# Patient Record
Sex: Female | Born: 1995
Health system: Southern US, Community
[De-identification: ages and names within clinical notes are randomized; demographics above are authoritative.]

## PROBLEM LIST (undated history)

## (undated) DIAGNOSIS — R630 Anorexia: Secondary | ICD-10-CM

## (undated) DIAGNOSIS — F603 Borderline personality disorder: Secondary | ICD-10-CM

## (undated) DIAGNOSIS — T7840XA Allergy, unspecified, initial encounter: Secondary | ICD-10-CM

## (undated) DIAGNOSIS — F419 Anxiety disorder, unspecified: Secondary | ICD-10-CM

## (undated) DIAGNOSIS — F32A Depression, unspecified: Secondary | ICD-10-CM

## (undated) DIAGNOSIS — F319 Bipolar disorder, unspecified: Secondary | ICD-10-CM

## (undated) DIAGNOSIS — D649 Anemia, unspecified: Secondary | ICD-10-CM

## (undated) HISTORY — DX: Allergy, unspecified, initial encounter: T78.40XA

---
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
## 2015-01-20 ENCOUNTER — Ambulatory Visit (INDEPENDENT_AMBULATORY_CARE_PROVIDER_SITE_OTHER): Payer: BLUE CROSS/BLUE SHIELD | Admitting: Licensed Clinical Social Worker

## 2015-01-20 DIAGNOSIS — F322 Major depressive disorder, single episode, severe without psychotic features: Secondary | ICD-10-CM | POA: Diagnosis not present

## 2015-01-25 ENCOUNTER — Ambulatory Visit: Payer: BLUE CROSS/BLUE SHIELD | Admitting: Internal Medicine

## 2016-07-08 ENCOUNTER — Emergency Department (HOSPITAL_BASED_OUTPATIENT_CLINIC_OR_DEPARTMENT_OTHER)
Admission: EM | Admit: 2016-07-08 | Discharge: 2016-07-08 | Disposition: A | Discharge status: Home / Self Care | Payer: Managed Care, Other (non HMO) | Attending: Emergency Medicine | Admitting: Emergency Medicine

## 2016-07-08 ENCOUNTER — Encounter (HOSPITAL_BASED_OUTPATIENT_CLINIC_OR_DEPARTMENT_OTHER): Payer: Self-pay

## 2016-07-08 DIAGNOSIS — R1013 Epigastric pain: Secondary | ICD-10-CM

## 2016-07-08 DIAGNOSIS — K2900 Acute gastritis without bleeding: Secondary | ICD-10-CM | POA: Insufficient documentation

## 2016-07-08 DIAGNOSIS — F17210 Nicotine dependence, cigarettes, uncomplicated: Secondary | ICD-10-CM | POA: Diagnosis not present

## 2016-07-08 DIAGNOSIS — J029 Acute pharyngitis, unspecified: Secondary | ICD-10-CM | POA: Insufficient documentation

## 2016-07-08 DIAGNOSIS — Z79899 Other long term (current) drug therapy: Secondary | ICD-10-CM | POA: Diagnosis not present

## 2016-07-08 DIAGNOSIS — R0981 Nasal congestion: Secondary | ICD-10-CM | POA: Diagnosis present

## 2016-07-08 HISTORY — DX: Bipolar disorder, unspecified: F31.9

## 2016-07-08 LAB — CBC WITH DIFFERENTIAL/PLATELET
Basophils Absolute: 0 10*3/uL (ref 0.0–0.1)
Basophils Relative: 0 %
EOS ABS: 0.2 10*3/uL (ref 0.0–0.7)
EOS PCT: 2 %
HCT: 38 % (ref 36.0–46.0)
HEMOGLOBIN: 12.8 g/dL (ref 12.0–15.0)
LYMPHS ABS: 3.4 10*3/uL (ref 0.7–4.0)
Lymphocytes Relative: 25 %
MCH: 31.4 pg (ref 26.0–34.0)
MCHC: 33.7 g/dL (ref 30.0–36.0)
MCV: 93.1 fL (ref 78.0–100.0)
MONO ABS: 0.9 10*3/uL (ref 0.1–1.0)
MONOS PCT: 6 %
Neutro Abs: 8.8 10*3/uL — ABNORMAL HIGH (ref 1.7–7.7)
Neutrophils Relative %: 67 %
PLATELETS: 296 10*3/uL (ref 150–400)
RBC: 4.08 MIL/uL (ref 3.87–5.11)
RDW: 13.2 % (ref 11.5–15.5)
WBC: 13.4 10*3/uL — ABNORMAL HIGH (ref 4.0–10.5)

## 2016-07-08 LAB — COMPREHENSIVE METABOLIC PANEL
ALK PHOS: 48 U/L (ref 38–126)
ALT: 10 U/L — ABNORMAL LOW (ref 14–54)
ANION GAP: 7 (ref 5–15)
AST: 18 U/L (ref 15–41)
Albumin: 4.6 g/dL (ref 3.5–5.0)
BUN: 9 mg/dL (ref 6–20)
CALCIUM: 9.6 mg/dL (ref 8.9–10.3)
CO2: 26 mmol/L (ref 22–32)
Chloride: 104 mmol/L (ref 101–111)
Creatinine, Ser: 0.62 mg/dL (ref 0.44–1.00)
GFR calc non Af Amer: >60 mL/min (ref >60)
Glucose, Bld: 92 mg/dL (ref 65–99)
POTASSIUM: 3.3 mmol/L — AB (ref 3.5–5.1)
SODIUM: 137 mmol/L (ref 135–145)
TOTAL PROTEIN: 7.7 g/dL (ref 6.5–8.1)
Total Bilirubin: 0.2 mg/dL — ABNORMAL LOW (ref 0.3–1.2)

## 2016-07-08 LAB — LIPASE, BLOOD: Lipase: 23 U/L (ref 11–51)

## 2016-07-08 MED ORDER — FAMOTIDINE 20 MG PO TABS: 20.0000 mg | ORAL_TABLET | Freq: Two times a day (BID) | ORAL | 0 refills | Status: DC

## 2016-07-08 MED ORDER — GI COCKTAIL ~~LOC~~
30.0000 mL | Freq: Once | ORAL | Status: AC
  Administered 2016-07-08: 30 mL via ORAL
  Filled 2016-07-08: qty 30

## 2016-07-08 NOTE — ED Triage Notes (Addendum)
C/o CP, worse with deep breath, "tender rib cage" x 4 days-states today having chills and sore throat-NAD-steady gait-pt seen at [facility]-states she had EKG, CXR and UA-results with pt

## 2016-07-08 NOTE — ED Notes (Signed)
C/o cp in sternum area x 4 days,  Denies inj pain increased w deep breath and movement,  Denies n/v/sob,

## 2016-07-08 NOTE — ED Provider Notes (Signed)
[facility] Provider Note   CSN: [id number] Arrival date & time: 07/08/16  1918 By signing my name below, I, Bridgette HabermannMaria Tan, attest that this documentation has been prepared under the direction and in the presence of Gwyneth SproutWhitney Glenwood Revoir, MD. Electronically Signed: Bridgette HabermannMaria Tan, ED Scribe. 07/08/16. 7:55 PM.  History   Chief Complaint Chief Complaint  Patient presents with  . Chest Pain   HPI Comments: Loretta Baker is a 20 y.o. female with no pertinent PMHx, who presents to the Emergency Department complaining of aching, throbbing, 6/10 chest pain onset 4 days ago. Pt also has associated chills, congestion, and sore throat. Pain is exacerbated with deep breathing and eating. Pt was seen at [facility] and notes she had an EKG, CXR and UA done and then sent here. Pt denies h/o GERD, DVT, PE, or asthma. No recent injury or long travel. She denies h/o similar symptoms. Pt is a smoker. She further denies fever, dysuria, nausea, vomiting, diarrhea, cough, or any other associated symptoms.   The history is provided by the patient. No language interpreter was used.    Past Medical History:  Diagnosis Date  . Allergy   . Bipolar disorder [facility](HCC)     Patient Active Problem List   Diagnosis Date Noted  . Tinnitus 12/14/2014  . GAD (generalized anxiety disorder) 12/14/2014    History reviewed. No pertinent surgical history.  OB History    No data available       Home Medications    Prior to Admission medications   Medication Sig Start Date End Date Taking? Authorizing Provider  ALPRAZolam (XANAX PO) Take by mouth.   Yes Historical Provider, MD  BuPROPion HCl (WELLBUTRIN PO) Take by mouth.   Yes Historical Provider, MD  FLUoxetine HCl (PROZAC PO) Take by mouth.   Yes Historical Provider, MD  LamoTRIgine (LAMICTAL PO) Take by mouth.   Yes Historical Provider, MD    Family History Family History  Problem Relation Age of Onset  . Arthritis Other   . Alcohol abuse Neg Hx   .  Cancer Neg Hx   . Depression Neg Hx   . Diabetes Neg Hx   . Early death Neg Hx   . Heart disease Neg Hx   . Hyperlipidemia Neg Hx   . Hypertension Neg Hx   . Stroke Neg Hx     Social History Social History  Substance Use Topics  . Smoking status: Current Every Day Smoker    Types: Cigarettes  . Smokeless tobacco: Never Used  . Alcohol use No     Allergies   Review of patient's allergies indicates no known allergies.   Review of Systems Review of Systems  Constitutional: Positive for chills. Negative for fever.  HENT: Positive for congestion and sore throat.   Respiratory: Negative for cough.   Cardiovascular: Positive for chest pain.  Gastrointestinal: Negative for diarrhea, nausea and vomiting.  Genitourinary: Negative for dysuria.  All other systems reviewed and are negative.    Physical Exam Updated Vital Signs BP 124/78 (BP Location: Left Arm)   Pulse 74   Temp 98.1 F (36.7 C) (Oral)   Resp 16   Ht 5\' 1"  (1.549 m)   Wt 105 lb (47.6 kg)   LMP 07/03/2016   SpO2 100%   BMI 19.84 kg/m   Physical Exam  Constitutional: She is oriented to person, place, and time. She appears well-developed and well-nourished.  HENT:  Head: Normocephalic and atraumatic.  Nose: Nose normal.  Eyes: EOM are normal. Pupils are equal, round, and reactive to light.  Neck: Normal range of motion. Neck supple. No JVD present.  Cardiovascular: Normal rate, regular rhythm and normal heart sounds.   No murmur heard. Pulmonary/Chest: Effort normal and breath sounds normal. She has no wheezes. She has no rales. She exhibits no tenderness.  Abdominal: Soft. Bowel sounds are normal. She exhibits no distension and no mass. There is tenderness. There is guarding. There is negative Murphy's sign.  Moderate epigastric tenderness with mild guarding.   Musculoskeletal: Normal range of motion. She exhibits no edema.  Lymphadenopathy:    She has no cervical adenopathy.  Neurological: She is  alert and oriented to person, place, and time. No cranial nerve deficit. She exhibits normal muscle tone. Coordination normal.  Skin: Skin is warm and dry. No rash noted.  Psychiatric: She has a normal mood and affect. Her behavior is normal. Judgment and thought content normal.  Nursing note and vitals reviewed.    ED Treatments / Results  DIAGNOSTIC STUDIES: Oxygen Saturation is 100% on RA, normal by my interpretation.    COORDINATION OF CARE: 7:55 PM Discussed treatment plan with pt at bedside which includes blood work and pt agreed to plan.  Labs (all labs ordered are listed, but only abnormal results are displayed) Labs Reviewed  CBC WITH DIFFERENTIAL/PLATELET - Abnormal; Notable for the following:       Result Value   WBC 13.4 (*)    Neutro Abs 8.8 (*)    All other components within normal limits  COMPREHENSIVE METABOLIC PANEL - Abnormal; Notable for the following:    Potassium 3.3 (*)    ALT 10 (*)    Total Bilirubin 0.2 (*)    All other components within normal limits  LIPASE, BLOOD    EKG  EKG Interpretation  Date/Time:  Monday July 08 2016 19:38:25 EDT Ventricular Rate:  76 PR Interval:    QRS Duration: 90 QT Interval:  354 QTC Calculation: 398 R Axis:   84 Text Interpretation:  Sinus arrhythmia No previous tracing Confirmed by Anitra LauthPLUNKETT  MD, Alphonzo LemmingsWHITNEY ([id number]) on 07/08/2016 7:47:56 PM       Radiology No results found.  Procedures Procedures (including critical care time)  Medications Ordered in ED Medications - No data to display   Initial Impression / Assessment and Plan / ED Course  I have reviewed the triage vital signs and the nursing notes.  Pertinent labs & imaging results that were available during my care of the patient were reviewed by me and considered in my medical decision making (see chart for details).  Clinical Course   Patient is a healthy 20 year old female with a history of bipolar disease presenting today with epigastric and  chest pain starting 4 days ago. Pain is worse with eating and she did notice sore throat when she woke up this morning. She denies any URI symptoms such as cough, rhinorrhea, fever. She denies any diarrhea but has had some stomach cramping this week but attributed it to her menses. Patient was seen in urgent care today and had an EKG which was within normal limits, normal chest x-ray and a negative urine. Patient denies any OCPs, unilateral leg pain or swelling, travel or prior history of PEs in herself or her family. PERC neg.  low suspicion for dissection at this time and no right upper quadrant tenderness consistent with cholecystitis. Patient denies excessive alcohol use or NSAID use. She also denies recent travel, bad food exposure or  antibiotics. Feel most likely patient's symptoms could be viral related or GERD. She was given GI cocktail. CBC, CMP, lipase pending  9:57 PM Labs with mild cytosis of 13,000 but otherwise normal CMP and lipase. Patient improved after GI cocktail. Feel that may be viral in origin. Patient placed on Pepcid and instructed to follow-up with PCP in 2 weeks if symptoms not improving. Final Clinical Impressions(s) / ED Diagnoses   Final diagnoses:  Acute epigastric pain  Acute gastritis without hemorrhage, unspecified gastritis type    New Prescriptions New Prescriptions   FAMOTIDINE (PEPCID) 20 MG TABLET    Take 1 tablet (20 mg total) by mouth 2 (two) times daily.   I personally performed the services described in this documentation, which was scribed in my presence.  The recorded information has been reviewed and considered.     Gwyneth Sprout, MD 07/08/16 2158

## 2018-07-21 ENCOUNTER — Ambulatory Visit: Payer: Self-pay

## 2018-07-21 NOTE — Telephone Encounter (Signed)
Patient called in with c/o "numbness, tingling to legs and arms." She says "this has been going on for about 1 1/2 years. I have numbness and tingling to my legs and feet, pin and needles pain. This goes on for about a month, so bad I can barely walk from the pain to my feet and it hurts to even put on pants. My skin feels like it's burning. After a month, it goes away and I'm fine. Right now, I don't have any symptoms. I just wanted to call since I have insurance now, to be seen about it." I asked about other symptoms, she denies. According to protocol, see PCP within 3 days. I advised that I will have to call to the office for the appointment due to the patient hasn't been seen by Dr. Yetta BarreJones since 2016, she verbalized understanding and agreed to hold the line. I called and spoke to [place], [facility] who received authorization to schedule the patient. She asked if the patient can come on Thursday, 07/23/18 at 1030, I asked the patient and she agreed to the appointment. [place] was made aware. I advised the patient to arrive at 1015, to bring her insurance card, photo ID, list of medications and money to cover a co-pay, if indicated, she verbalized understanding. Care advice given, patient verbalized understanding.   Reason for Disposition . [1] Numbness or tingling in one or both feet AND [2] is a chronic symptom (recurrent or ongoing AND present > 4 weeks)  Answer Assessment - Initial Assessment Questions 1. SYMPTOM: "What is the main symptom you are concerned about?" (e.g., weakness, numbness)     Numbness to arms and legs 2. ONSET: "When did this start?" (minutes, hours, days; while sleeping)     About 1 1/2 years, but becoming more frequent and more painful 3. LAST NORMAL: "When was the last time you were normal (no symptoms)?"     Right now I feel normal 4. PATTERN "Does this come and go, or has it been constant since it started?"  "Is it present now?"     Come and go; not present 5. CARDIAC  SYMPTOMS: "Have you had any of the following symptoms: chest pain, difficulty breathing, palpitations?"     No 6. NEUROLOGIC SYMPTOMS: "Have you had any of the following symptoms: headache, dizziness, vision loss, double vision, changes in speech, unsteady on your feet?"     No 7. OTHER SYMPTOMS: "Do you have any other symptoms?"     No 8. PREGNANCY: "Is there any chance you are pregnant?" "When was your last menstrual period?"     No; LMP October 28th  Protocols used: NEUROLOGIC DEFICIT-A-AH

## 2018-07-23 ENCOUNTER — Ambulatory Visit (INDEPENDENT_AMBULATORY_CARE_PROVIDER_SITE_OTHER): Payer: BLUE CROSS/BLUE SHIELD | Admitting: Internal Medicine

## 2018-07-23 ENCOUNTER — Encounter: Payer: Self-pay | Admitting: Internal Medicine

## 2018-07-23 ENCOUNTER — Other Ambulatory Visit (INDEPENDENT_AMBULATORY_CARE_PROVIDER_SITE_OTHER): Payer: BLUE CROSS/BLUE SHIELD

## 2018-07-23 ENCOUNTER — Encounter: Payer: Self-pay | Admitting: Neurology

## 2018-07-23 ENCOUNTER — Telehealth: Payer: Self-pay | Admitting: Internal Medicine

## 2018-07-23 VITALS — BP 106/70 | HR 98 | Temp 98.6°F | Resp 16 | Ht 61.0 in | Wt 116.8 lb

## 2018-07-23 DIAGNOSIS — R202 Paresthesia of skin: Secondary | ICD-10-CM

## 2018-07-23 DIAGNOSIS — F319 Bipolar disorder, unspecified: Secondary | ICD-10-CM

## 2018-07-23 DIAGNOSIS — Z720 Tobacco use: Secondary | ICD-10-CM

## 2018-07-23 DIAGNOSIS — F411 Generalized anxiety disorder: Secondary | ICD-10-CM

## 2018-07-23 DIAGNOSIS — F1210 Cannabis abuse, uncomplicated: Secondary | ICD-10-CM | POA: Insufficient documentation

## 2018-07-23 DIAGNOSIS — R200 Anesthesia of skin: Secondary | ICD-10-CM

## 2018-07-23 DIAGNOSIS — Z23 Encounter for immunization: Secondary | ICD-10-CM | POA: Diagnosis not present

## 2018-07-23 LAB — CBC WITH DIFFERENTIAL/PLATELET
BASOS PCT: 0.8 % (ref 0.0–3.0)
Basophils Absolute: 0.1 10*3/uL (ref 0.0–0.1)
EOS ABS: 0.2 10*3/uL (ref 0.0–0.7)
Eosinophils Relative: 2.1 % (ref 0.0–5.0)
HEMATOCRIT: 41.1 % (ref 36.0–46.0)
Hemoglobin: 13.6 g/dL (ref 12.0–15.0)
LYMPHS PCT: 32.6 % (ref 12.0–46.0)
Lymphs Abs: 2.7 10*3/uL (ref 0.7–4.0)
MCHC: 33.2 g/dL (ref 30.0–36.0)
MCV: 92.3 fl (ref 78.0–100.0)
MONOS PCT: 8.6 % (ref 3.0–12.0)
Monocytes Absolute: 0.7 10*3/uL (ref 0.1–1.0)
NEUTROS ABS: 4.6 10*3/uL (ref 1.4–7.7)
Neutrophils Relative %: 55.9 % (ref 43.0–77.0)
PLATELETS: 338 10*3/uL (ref 150.0–400.0)
RBC: 4.46 Mil/uL (ref 3.87–5.11)
RDW: 13.5 % (ref 11.5–15.5)
WBC: 8.2 10*3/uL (ref 4.0–10.5)

## 2018-07-23 LAB — URINALYSIS, ROUTINE W REFLEX MICROSCOPIC
Bilirubin Urine: NEGATIVE
Hgb urine dipstick: NEGATIVE
Ketones, ur: NEGATIVE
Nitrite: NEGATIVE
PH: 7 (ref 5.0–8.0)
RBC / HPF: NONE SEEN (ref 0–?)
SPECIFIC GRAVITY, URINE: 1.01 (ref 1.000–1.030)
Total Protein, Urine: NEGATIVE
URINE GLUCOSE: NEGATIVE
UROBILINOGEN UA: 0.2 (ref 0.0–1.0)

## 2018-07-23 LAB — COMPREHENSIVE METABOLIC PANEL
ALK PHOS: 47 U/L (ref 39–117)
ALT: 7 U/L (ref 0–35)
AST: 13 U/L (ref 0–37)
Albumin: 5.2 g/dL (ref 3.5–5.2)
BUN: 11 mg/dL (ref 6–23)
CO2: 27 mEq/L (ref 19–32)
Calcium: 10.3 mg/dL (ref 8.4–10.5)
Chloride: 105 mEq/L (ref 96–112)
Creatinine, Ser: 0.69 mg/dL (ref 0.40–1.20)
GFR: 112.48 mL/min (ref >60.00)
GLUCOSE: 94 mg/dL (ref 70–99)
POTASSIUM: 4.8 meq/L (ref 3.5–5.1)
SODIUM: 140 meq/L (ref 135–145)
TOTAL PROTEIN: 8.1 g/dL (ref 6.0–8.3)
Total Bilirubin: 0.5 mg/dL (ref 0.2–1.2)

## 2018-07-23 LAB — HCG, QUANTITATIVE, PREGNANCY: Quantitative HCG: 0.28 m[IU]/mL

## 2018-07-23 LAB — VITAMIN B12: VITAMIN B 12: 338 pg/mL (ref 211–911)

## 2018-07-23 LAB — FOLATE: FOLATE: 8.1 ng/mL (ref >5.9)

## 2018-07-23 LAB — SEDIMENTATION RATE: Sed Rate: 8 mm/hr (ref 0–20)

## 2018-07-23 LAB — TSH: TSH: 1.18 u[IU]/mL (ref 0.35–4.50)

## 2018-07-23 LAB — CK: Total CK: 49 U/L (ref 7–177)

## 2018-07-23 MED ORDER — LAMOTRIGINE 25 MG PO TABS: 25.0000 mg | ORAL_TABLET | Freq: Two times a day (BID) | ORAL | 1 refills | Status: AC

## 2018-07-23 NOTE — Telephone Encounter (Signed)
Patient calling and states that her alprazolam prescription was never sent. Please advise. Patient states that she needs this prescription. Would like a phone call tomorrow if this can not be done.

## 2018-07-23 NOTE — Telephone Encounter (Signed)
Copied from CRM [phone]#[id number]. Topic: General - Other >> Jul 23, 2018 11:57 AM Leafy Roobinson, Norma J wrote: Reason for CRM: pt saw dr Yetta Barrejones today and would like new rx  [facility]

## 2018-07-23 NOTE — Progress Notes (Signed)
Subjective:  Patient ID: Loretta Baker Schermerhorn, female    DOB: 1996/03/10  Age: 22 y.o. MRN: [id number]  CC: Anxiety   HPI Loretta Baker Hunkins presents for multiple concerns.  1.  She tells me that she has a history of bipolar disorder, obsessive-compulsive disorder, and eating disorder and over the last few months she complains of paralyzing anxiety and panic to the point that she is afraid to leave her house.  She tells me she was previously treated successfully with Xanax.  She also wants to start lamotrigine for bipolar disorder and to be referred to a psychiatrist.  She denies SI or HI.  2.  She complains of a nearly 2-year history of throbbing, burning pain throughout her lower extremities as well as numbness and tingling in both upper extremities.  She describes the discomfort as feeling like her skin is extremely sensitive.  She denies headache, neck pain, or low back pain.  She denies visual disturbance or ataxia.  Outpatient Medications Prior to Visit  Medication Sig Dispense Refill  . ALPRAZolam (XANAX PO) Take by mouth.    . BuPROPion HCl (WELLBUTRIN PO) Take by mouth.    . famotidine (PEPCID) 20 MG tablet Take 1 tablet (20 mg total) by mouth 2 (two) times daily. 28 tablet 0  . FLUoxetine HCl (PROZAC PO) Take by mouth.    . LamoTRIgine (LAMICTAL PO) Take by mouth.     No facility-administered medications prior to visit.     ROS Review of Systems  Constitutional: Positive for fatigue. Negative for appetite change, diaphoresis and unexpected weight change.  HENT: Negative.   Eyes: Negative for visual disturbance.  Respiratory: Negative for cough, chest tightness, shortness of breath and wheezing.   Cardiovascular: Negative for chest pain, palpitations and leg swelling.  Gastrointestinal: Negative.  Negative for abdominal pain, constipation, diarrhea, nausea and vomiting.  Endocrine: Negative.   Genitourinary: Negative.  Negative for decreased urine volume, difficulty  urinating, vaginal bleeding and vaginal discharge.  Musculoskeletal: Positive for myalgias. Negative for arthralgias, back pain, joint swelling, neck pain and neck stiffness.  Skin: Negative for color change and rash.  Neurological: Positive for numbness. Negative for dizziness, tremors, seizures, speech difficulty, weakness, light-headedness and headaches.  Hematological: Negative for adenopathy. Does not bruise/bleed easily.  Psychiatric/Behavioral: Positive for dysphoric mood and sleep disturbance. Negative for agitation, behavioral problems, confusion, decreased concentration, hallucinations, self-injury and suicidal ideas. The patient is nervous/anxious. The patient is not hyperactive.     Objective:  BP 106/70 (BP Location: Left Arm, Patient Position: Sitting, Cuff Size: Normal)   Pulse 98   Temp 98.6 F (37 C) (Oral)   Resp 16   Ht 5\' 1"  (1.549 m)   Wt 116 lb 12 oz (53 kg)   LMP 07/06/2018   SpO2 99%   BMI 22.06 kg/m   BP Readings from Last 3 Encounters:  07/23/18 106/70  07/08/16 111/66  12/14/14 102/60    Wt Readings from Last 3 Encounters:  07/23/18 116 lb 12 oz (53 kg)  07/08/16 105 lb (47.6 kg)  12/14/14 112 lb (50.8 kg) (21 %, Z= -0.82)*   * Growth percentiles are based on CDC (Girls, 2-20 Years) data.    Physical Exam  Constitutional: She is oriented to person, place, and time. No distress.  HENT:  Mouth/Throat: Oropharynx is clear and moist. No oropharyngeal exudate.  Eyes: Conjunctivae are normal. No scleral icterus.  Neck: Normal range of motion. Neck supple. No JVD present. No thyromegaly present.  Cardiovascular:  Normal rate, regular rhythm and normal heart sounds. Exam reveals no gallop.  No murmur heard. Pulmonary/Chest: Effort normal and breath sounds normal. No respiratory distress. She has no wheezes. She has no rales.  Abdominal: Soft. Bowel sounds are normal. She exhibits no mass. There is no hepatosplenomegaly. There is no tenderness.    Musculoskeletal: Normal range of motion. She exhibits no edema, tenderness or deformity.  Lymphadenopathy:    She has no cervical adenopathy.  Neurological: She is alert and oriented to person, place, and time. She has normal strength. She displays abnormal reflex. She displays no atrophy and no tremor. No cranial nerve deficit or sensory deficit. She exhibits normal muscle tone. She displays a negative Romberg sign. She displays no seizure activity. Coordination and gait normal. She displays no Babinski's sign on the right side. She displays no Babinski's sign on the left side.  Reflex Scores:      Tricep reflexes are 1+ on the right side and 0 on the left side.      Bicep reflexes are 1+ on the right side and 0 on the left side.      Brachioradialis reflexes are 1+ on the right side.      Patellar reflexes are 1+ on the right side and 2+ on the left side.      Achilles reflexes are 1+ on the right side and 0 on the left side. Skin: Skin is warm and dry. She is not diaphoretic. No pallor.  Vitals reviewed.   Lab Results  Component Value Date   WBC 8.2 07/23/2018   HGB 13.6 07/23/2018   HCT 41.1 07/23/2018   PLT 338.0 07/23/2018   GLUCOSE 94 07/23/2018   ALT 7 07/23/2018   AST 13 07/23/2018   NA 140 07/23/2018   K 4.8 07/23/2018   CL 105 07/23/2018   CREATININE 0.69 07/23/2018   BUN 11 07/23/2018   CO2 27 07/23/2018   TSH 1.18 07/23/2018    No results found.  Assessment & Plan:   Saraiyah was seen today for anxiety.  Diagnoses and all orders for this visit:  Need for Tdap vaccination -     Tdap vaccine greater than or equal to 7yo IM  Need for influenza vaccination -     Flu Vaccine QUAD 36+ mos IM  GAD (generalized anxiety disorder) -     ALPRAZolam (XANAX) 0.25 MG tablet; Take 1 tablet (0.25 mg total) by mouth 3 (three) times daily as needed for anxiety.  Marijuana abuse  Tobacco abuse  Bipolar 1 disorder (HCC)- I will screen her urine for substance abuse.  Her  beta-hCG is negative.  Will restart low-dose lamotrigine.  I also referred her to Forde Radon (a psychiatrist at [facility].)  I gave her his name, phone number, and address and asked her to schedule an appt with him as soon as possible. -     hCG, quantitative, pregnancy; Future -     Urine drugs of abuse scrn w alc, routine (Ref Lab); Future -     lamoTRIgine (LAMICTAL) 25 MG tablet; Take 1 tablet (25 mg total) by mouth 2 (two) times daily.  Numbness and tingling of upper and lower extremities of both sides- Her labs are negative for any metabolic or organic condition that would cause her neurological symptoms.  Her exam is only remarkable for being hyperreflexic in the left patella.  I have asked her to undergo an NCS/EMG to see if there is some element of myopathy or  neuropathy to explain her symptoms. -     CBC with Differential/Platelet; Future -     TSH; Future -     hCG, quantitative, pregnancy; Future -     Comprehensive metabolic panel; Future -     Vitamin B12; Future -     Sedimentation rate; Future -     CK; Future -     Folate; Future -     Urine drugs of abuse scrn w alc, routine (Ref Lab); Future -     Ambulatory referral to Neurology -     Urinalysis, Routine w reflex microscopic; Future   I have discontinued Aariona Baker. Stegeman's LamoTRIgine (LAMICTAL PO), FLUoxetine HCl (PROZAC PO), ALPRAZolam (XANAX PO), BuPROPion HCl (WELLBUTRIN PO), and famotidine. I am also having her start on lamoTRIgine and ALPRAZolam.  Meds ordered this encounter  Medications  . lamoTRIgine (LAMICTAL) 25 MG tablet    Sig: Take 1 tablet (25 mg total) by mouth 2 (two) times daily.    Dispense:  60 tablet    Refill:  1  . ALPRAZolam (XANAX) 0.25 MG tablet    Sig: Take 1 tablet (0.25 mg total) by mouth 3 (three) times daily as needed for anxiety.    Dispense:  90 tablet    Refill:  2     Follow-up: Return in about 4 weeks (around 08/20/2018).  Sanda Linger, MD

## 2018-07-23 NOTE — Patient Instructions (Signed)

## 2018-07-24 ENCOUNTER — Other Ambulatory Visit: Payer: Self-pay | Admitting: *Deleted

## 2018-07-24 DIAGNOSIS — R202 Paresthesia of skin: Principal | ICD-10-CM

## 2018-07-24 DIAGNOSIS — R200 Anesthesia of skin: Secondary | ICD-10-CM

## 2018-07-24 MED ORDER — ALPRAZOLAM 0.25 MG PO TABS: 0.2500 mg | ORAL_TABLET | Freq: Three times a day (TID) | ORAL | 2 refills | Status: DC | PRN

## 2018-07-24 NOTE — Telephone Encounter (Signed)
Pt called and stated that she would like the alprazolam sent in to [facility] [address]. Pt states that she really needs this medication. Pt would like a call back from nurse Cb#[phone]

## 2018-07-28 LAB — URINE DRUGS OF ABUSE SCREEN W ALC, ROUTINE (REF LAB)
Amphetamines, Urine: NEGATIVE ng/mL
Barbiturate Quant, Ur: NEGATIVE ng/mL
COCAINE (METAB.): NEGATIVE ng/mL
ETHANOL, URINE: NEGATIVE %
METHADONE SCREEN, URINE: NEGATIVE ng/mL
Opiate Quant, Ur: NEGATIVE ng/mL
PCP Quant, Ur: NEGATIVE ng/mL
Propoxyphene: NEGATIVE ng/mL

## 2018-07-28 LAB — DRUG PROFILE 799031: BENZODIAZEPINES: NEGATIVE

## 2018-07-28 LAB — PANEL 799049
CANNABINOID GC/MS UR: POSITIVE — AB
CARBOXY THC GC/MS CONF: >400 ng/mL

## 2018-08-01 DIAGNOSIS — F3181 Bipolar II disorder: Secondary | ICD-10-CM | POA: Diagnosis not present

## 2018-08-11 ENCOUNTER — Ambulatory Visit (INDEPENDENT_AMBULATORY_CARE_PROVIDER_SITE_OTHER): Payer: BLUE CROSS/BLUE SHIELD | Admitting: Neurology

## 2018-08-11 DIAGNOSIS — R200 Anesthesia of skin: Secondary | ICD-10-CM | POA: Diagnosis not present

## 2018-08-11 DIAGNOSIS — R202 Paresthesia of skin: Secondary | ICD-10-CM | POA: Diagnosis not present

## 2018-08-11 NOTE — Procedures (Signed)
[facility]  [address], [address]  [place] [address] Tel: [phone] Fax:  [phone] Test Date:  08/11/2018  Patient: Loretta Baker DOB: May 10, 1996 Physician: Nita Sickleonika Jahkeem Kurka, DO  Sex: Female Height: 5\' 3"  Ref Phys: Sanda Lingerhomas Jones, M.D.  ID#: [id number] Temp: 32.0C Technician:    Patient Complaints: This is a 22 year-old female referred for evaluation of bilateral leg pain and paresthesias.  NCV & EMG Findings: Extensive electrodiagnostic testing of the right lower extremity and additional studies of the left shows:  1. Bilateral sural and superficial peroneal sensory responses are within normal limits. 2. Bilateral peroneal motor responses show reduced amplitude at the extensor digitorum brevis, however there is evidence of anomalous innervation as seen by a motor response when stimulating at the medial malleolus, consistent with an accessory peroneal nerve.  Bilateral tibial motor responses are within normal limits. 3. Bilateral tibial H reflex studies are within normal limits. 4. There is no evidence of active or chronic motor axonal loss changes affecting any of the tested muscles.  Motor unit configuration and recruitment pattern is within normal limits.  Impression: This is a normal study of the lower extremities.  In particular, there is no evidence of a sensorimotor polyneuropathy, diffuse myopathy, or lumbosacral radiculopathy.    Incidentally, there is evidence of an accessory peroneal nerve bilaterally, a normal anatomical variant.   ___________________________ Nita Sickleonika [name], DO    Nerve Conduction Studies Anti Sensory Summary Table   Stim Site NR Peak (ms) Norm Peak (ms) P-T Amp (V) Norm P-T Amp  Left Sup Peroneal Anti Sensory (Ant Lat Mall)  32C  12 cm    2.2 <4.4 29.6 >6  Right Sup Peroneal Anti Sensory (Ant Lat Mall)  32C  12 cm    2.5 <4.4 28.8 >6  Left Sural Anti Sensory (Lat Mall)  32C  Calf    3.0 <4.4 21.8 >6  Right Sural  Anti Sensory (Lat Mall)  32C  Calf    3.3 <4.4 22.6 >6   Motor Summary Table   Stim Site NR Onset (ms) Norm Onset (ms) O-P Amp (mV) Norm O-P Amp Site1 Site2 Delta-0 (ms) Dist (cm) Vel (m/s) Norm Vel (m/s)  Left Peroneal Motor (Ext Dig Brev)  32C  Ankle    4.9 <5.5 1.9 >3 B Fib Ankle 7.7 38.0 49 >41  B Fib    12.6  1.6  Poplt B Fib 1.0 8.0 80 >41  Poplt    13.6  1.4         Medial malleolus    3.1  9.7         Right Peroneal Motor (Ext Dig Brev)  32C  Ankle    3.9 <5.5 2.5 >3 B Fib Ankle 6.2 38.0 61 >41  B Fib    10.1  2.8  Poplt B Fib 1.1 8.0 73 >41  Poplt    11.2  2.8         Medial malleolus    3.3  10.3         Left Peroneal TA Motor (Tib Ant)  32C  Fib Head    2.9 <4.0 4.4 >4 Poplit Fib Head 0.7 8.0 114 >41  Poplit    3.6  4.0         Right Peroneal TA Motor (Tib Ant)  32C  Fib Head    2.2 <4.0 5.4 >4 Poplit Fib Head 1.4 8.0 57 >41  Poplit    3.6  4.7  Left Tibial Motor (Abd Hall Brev)  32C  Ankle    2.9 <5.8 19.2 >8 Knee Ankle 7.3 39.0 53 >41  Knee    10.2  13.9         Right Tibial Motor (Abd Hall Brev)  32C  Ankle    3.2 <5.8 14.6 >8 Knee Ankle 7.3 38.0 52 >41  Knee    10.5  11.5          H Reflex Studies   NR H-Lat (ms) Lat Norm (ms) L-R H-Lat (ms)  Left Tibial (Gastroc)  32C     26.12 <35 0.00  Right Tibial (Gastroc)  32C     26.12 <35 0.00   EMG   Side Muscle Ins Act Fibs Psw Fasc Number Recrt Dur Dur. Amp Amp. Poly Poly. Comment  Left AntTibialis Nml Nml Nml Nml Nml Nml Nml Nml Nml Nml Nml Nml N/A  Right AntTibialis Nml Nml Nml Nml Nml Nml Nml Nml Nml Nml Nml Nml N/A  Right Gastroc Nml Nml Nml Nml Nml Nml Nml Nml Nml Nml Nml Nml N/A  Right Flex Dig Long Nml Nml Nml Nml Nml Nml Nml Nml Nml Nml Nml Nml N/A  Right RectFemoris Nml Nml Nml Nml Nml Nml Nml Nml Nml Nml Nml Nml N/A  Right GluteusMed Nml Nml Nml Nml Nml Nml Nml Nml Nml Nml Nml Nml N/A  Left Gastroc Nml Nml Nml Nml Nml Nml Nml Nml Nml Nml Nml Nml N/A  Left Flex Dig Long Nml Nml Nml Nml  Nml Nml Nml Nml Nml Nml Nml Nml N/A  Left RectFemoris Nml Nml Nml Nml Nml Nml Nml Nml Nml Nml Nml Nml N/A  Left GluteusMed Nml Nml Nml Nml Nml Nml Nml Nml Nml Nml Nml Nml N/A  Left Lumbo Parasp Low Nml Nml Nml Nml Nml Nml Nml Nml Nml Nml Nml Nml N/A  Left Iliopsoas Nml Nml Nml Nml Nml Nml Nml Nml Nml Nml Nml Nml N/A      Waveforms:

## 2018-08-22 DIAGNOSIS — F3181 Bipolar II disorder: Secondary | ICD-10-CM | POA: Diagnosis not present

## 2018-08-24 ENCOUNTER — Other Ambulatory Visit (INDEPENDENT_AMBULATORY_CARE_PROVIDER_SITE_OTHER): Payer: BLUE CROSS/BLUE SHIELD

## 2018-08-24 ENCOUNTER — Other Ambulatory Visit: Payer: BLUE CROSS/BLUE SHIELD

## 2018-08-24 ENCOUNTER — Encounter: Payer: Self-pay | Admitting: Internal Medicine

## 2018-08-24 ENCOUNTER — Ambulatory Visit: Payer: BLUE CROSS/BLUE SHIELD | Admitting: Internal Medicine

## 2018-08-24 ENCOUNTER — Other Ambulatory Visit (HOSPITAL_COMMUNITY)
Admission: RE | Admit: 2018-08-24 | Discharge: 2018-08-24 | Disposition: A | Discharge status: Home / Self Care | Payer: BLUE CROSS/BLUE SHIELD | Source: Ambulatory Visit | Attending: Internal Medicine | Admitting: Internal Medicine

## 2018-08-24 VITALS — BP 110/60 | HR 81 | Temp 98.3°F | Ht 61.0 in | Wt 116.0 lb

## 2018-08-24 DIAGNOSIS — R202 Paresthesia of skin: Secondary | ICD-10-CM

## 2018-08-24 DIAGNOSIS — Z0000 Encounter for general adult medical examination without abnormal findings: Secondary | ICD-10-CM | POA: Insufficient documentation

## 2018-08-24 DIAGNOSIS — Z23 Encounter for immunization: Secondary | ICD-10-CM | POA: Diagnosis not present

## 2018-08-24 DIAGNOSIS — R200 Anesthesia of skin: Secondary | ICD-10-CM

## 2018-08-24 DIAGNOSIS — N898 Other specified noninflammatory disorders of vagina: Secondary | ICD-10-CM | POA: Diagnosis not present

## 2018-08-24 DIAGNOSIS — Z124 Encounter for screening for malignant neoplasm of cervix: Secondary | ICD-10-CM | POA: Insufficient documentation

## 2018-08-24 DIAGNOSIS — N760 Acute vaginitis: Secondary | ICD-10-CM

## 2018-08-24 DIAGNOSIS — F411 Generalized anxiety disorder: Secondary | ICD-10-CM

## 2018-08-24 DIAGNOSIS — B9689 Other specified bacterial agents as the cause of diseases classified elsewhere: Secondary | ICD-10-CM | POA: Insufficient documentation

## 2018-08-24 LAB — LIPID PANEL
CHOLESTEROL: 108 mg/dL (ref 0–200)
HDL: 44.5 mg/dL (ref >39.00)
LDL CALC: 56 mg/dL (ref 0–99)
NonHDL: 63.29
TRIGLYCERIDES: 37 mg/dL (ref 0.0–149.0)
Total CHOL/HDL Ratio: 2
VLDL: 7.4 mg/dL (ref 0.0–40.0)

## 2018-08-24 LAB — WET PREP, GENITAL
Trich, Wet Prep: NONE SEEN — AB
Yeast Wet Prep HPF POC: NONE SEEN — AB

## 2018-08-24 LAB — HCG, QUANTITATIVE, PREGNANCY: QUANTITATIVE HCG: 0.1 m[IU]/mL

## 2018-08-24 MED ORDER — METRONIDAZOLE 500 MG PO TABS: 500.0000 mg | ORAL_TABLET | Freq: Two times a day (BID) | ORAL | 0 refills | Status: AC

## 2018-08-24 NOTE — Progress Notes (Signed)
Subjective:  Patient ID: Loretta Baker, female    DOB: 28-Jul-1996  Age: 22 y.o. MRN: [id number]  CC: Annual Exam   HPI Loretta Baker presents for a CPX  She complains of persistent myalgias, numbness, and tingling.  She is crying today because she thinks this is "a nerve pain."  She recently underwent NCS/EMG that were normal.  She has seen a psychiatrist since I last saw her and Seroquel has been added.  Outpatient Medications Prior to Visit  Medication Sig Dispense Refill  . clonazePAM (KLONOPIN) 0.5 MG tablet Take 0.5 mg by mouth 3 (three) times daily.  3  . lamoTRIgine (LAMICTAL) 25 MG tablet Take 1 tablet (25 mg total) by mouth 2 (two) times daily. 60 tablet 1  . QUEtiapine (SEROQUEL) 100 MG tablet TAKE 1/2 A TABLET EVERY NIGHT FOR 7 DAYS, THEN ONE EVERY NIGHT  3  . ALPRAZolam (XANAX) 0.25 MG tablet Take 1 tablet (0.25 mg total) by mouth 3 (three) times daily as needed for anxiety. 90 tablet 2   No facility-administered medications prior to visit.     ROS Review of Systems  Constitutional: Negative for chills, fatigue and fever.  HENT: Negative.  Negative for sore throat and trouble swallowing.   Eyes: Negative.   Respiratory: Negative.  Negative for cough, chest tightness, shortness of breath and wheezing.   Cardiovascular: Negative for chest pain, palpitations and leg swelling.  Gastrointestinal: Negative for abdominal pain, constipation, diarrhea, nausea and vomiting.  Genitourinary: Positive for vaginal discharge. Negative for decreased urine volume, difficulty urinating, dyspareunia, dysuria, flank pain, frequency, genital sores, hematuria, menstrual problem, pelvic pain, urgency, vaginal bleeding and vaginal pain.  Musculoskeletal: Positive for myalgias. Negative for arthralgias.  Skin: Negative.  Negative for color change and rash.  Neurological: Positive for numbness.  Hematological: Negative for adenopathy. Does not bruise/bleed easily.    Psychiatric/Behavioral: Positive for dysphoric mood. Negative for decreased concentration, self-injury, sleep disturbance and suicidal ideas. The patient is nervous/anxious.        Tearful    Objective:  BP 110/60 (BP Location: Left Arm, Patient Position: Sitting, Cuff Size: Normal)   Pulse 81   Temp 98.3 F (36.8 C) (Oral)   Ht 5\' 1"  (1.549 m)   Wt 116 lb (52.6 kg)   LMP 08/02/2018 (Exact Date) Comment: uses condoms  SpO2 98%   BMI 21.92 kg/m   BP Readings from Last 3 Encounters:  08/24/18 110/60  07/23/18 106/70  07/08/16 111/66    Wt Readings from Last 3 Encounters:  08/24/18 116 lb (52.6 kg)  07/23/18 116 lb 12 oz (53 kg)  07/08/16 105 lb (47.6 kg)    Physical Exam Vitals signs reviewed. Exam conducted with a chaperone present.  Constitutional:      General: She is not in acute distress.    Appearance: Normal appearance. She is not ill-appearing, toxic-appearing or diaphoretic.  HENT:     Nose: Nose normal.     Mouth/Throat:     Mouth: Mucous membranes are moist.     Pharynx: No oropharyngeal exudate or posterior oropharyngeal erythema.  Eyes:     Conjunctiva/sclera: Conjunctivae normal.  Neck:     Musculoskeletal: Normal range of motion and neck supple. No neck rigidity or muscular tenderness.  Cardiovascular:     Rate and Rhythm: Normal rate and regular rhythm.     Pulses: Normal pulses.     Heart sounds: Normal heart sounds. No murmur. No gallop.   Pulmonary:  Effort: Pulmonary effort is normal.     Breath sounds: Normal breath sounds. No wheezing, rhonchi or rales.  Abdominal:     General: Abdomen is flat. Bowel sounds are normal.     Palpations: Abdomen is soft. There is no hepatomegaly, splenomegaly or mass.     Tenderness: There is no abdominal tenderness. There is no guarding.     Hernia: No hernia is present. There is no hernia in the right inguinal area or left inguinal area.  Genitourinary:    General: Normal vulva.     Exam position:  Supine.     Pubic Area: No rash.      Labia:        Right: No rash, tenderness, lesion or injury.        Left: No rash, tenderness, lesion or injury.      Urethra: No prolapse, urethral pain, urethral swelling or urethral lesion.     Vagina: Foreign body (very small piece of fabric/cloth removed ) present. Vaginal discharge present. No erythema, tenderness, bleeding, lesions or prolapsed vaginal walls.     Cervix: Discharge present. No cervical motion tenderness, friability, lesion, erythema, cervical bleeding or eversion.     Uterus: Absent. Not deviated, not enlarged, not fixed, not tender and no uterine prolapse.      Adnexa: Right adnexa normal.       Right: No mass, tenderness or fullness.         Left: No mass, tenderness or fullness.       Comments: Scant, white, creamy exudate in the vaginal vault Lymphadenopathy:     Head:     Right side of head: No occipital adenopathy.     Left side of head: No occipital adenopathy.     Cervical: No cervical adenopathy.     Right cervical: No superficial, deep or posterior cervical adenopathy.    Left cervical: No superficial, deep or posterior cervical adenopathy.     Upper Body:     Right upper body: No supraclavicular or axillary adenopathy.     Left upper body: No supraclavicular or axillary adenopathy.     Lower Body: No right inguinal adenopathy. No left inguinal adenopathy.  Neurological:     Mental Status: She is alert.     Lab Results  Component Value Date   WBC 8.2 07/23/2018   HGB 13.6 07/23/2018   HCT 41.1 07/23/2018   PLT 338.0 07/23/2018   GLUCOSE 94 07/23/2018   CHOL 108 08/24/2018   TRIG 37.0 08/24/2018   HDL 44.50 08/24/2018   LDLCALC 56 08/24/2018   ALT 7 07/23/2018   AST 13 07/23/2018   NA 140 07/23/2018   K 4.8 07/23/2018   CL 105 07/23/2018   CREATININE 0.69 07/23/2018   BUN 11 07/23/2018   CO2 27 07/23/2018   TSH 1.18 07/23/2018    No results found.  Assessment & Plan:   Loretta Baker was seen today  for annual exam.  Diagnoses and all orders for this visit:  Numbness and tingling of upper and lower extremities of both sides -     Ambulatory referral to Neurology  GAD (generalized anxiety disorder)  Routine general medical examination at a health care facility- Exam completed, labs reviewed, vaccines reviewed, she was encouraged to and agrees to use condoms 100% of the time since she is taking 3 different medications, patient education material was given. -     HIV Antibody (routine testing w rflx); Future -     Lipid panel;  Future -     hCG, quantitative, pregnancy; Future  Vaginal discharge - Wet prep is positive for clue cells. -     RPR; Future -     Wet prep, genital; Future  Cervical cancer screening -     Cytology - PAP; Future  Need for HPV vaccine -     HPV 9-valent vaccine,Recombinat  Bacterial vaginitis -     metroNIDAZOLE (FLAGYL) 500 MG tablet; Take 1 tablet (500 mg total) by mouth 2 (two) times daily for 7 days.   I have discontinued Monserrath G. Wienke's ALPRAZolam. I am also having her start on metroNIDAZOLE. Additionally, I am having her maintain her lamoTRIgine, QUEtiapine, and clonazePAM.  Meds ordered this encounter  Medications  . metroNIDAZOLE (FLAGYL) 500 MG tablet    Sig: Take 1 tablet (500 mg total) by mouth 2 (two) times daily for 7 days.    Dispense:  14 tablet    Refill:  0     Follow-up: Return in about 3 months (around 11/23/2018).  Sanda Linger, MD

## 2018-08-24 NOTE — Patient Instructions (Signed)
Preventive Care 18-39 Years, Female Preventive care refers to lifestyle choices and visits with your health care provider that can promote health and wellness. What does preventive care include?  A yearly physical exam. This is also called an annual well check.  Dental exams once or twice a year.  Routine eye exams. Ask your health care provider how often you should have your eyes checked.  Personal lifestyle choices, including: ? Daily care of your teeth and gums. ? Regular physical activity. ? Eating a healthy diet. ? Avoiding tobacco and drug use. ? Limiting alcohol use. ? Practicing safe sex. ? Taking vitamin and mineral supplements as recommended by your health care provider. What happens during an annual well check? The services and screenings done by your health care provider during your annual well check will depend on your age, overall health, lifestyle risk factors, and family history of disease. Counseling Your health care provider may ask you questions about your:  Alcohol use.  Tobacco use.  Drug use.  Emotional well-being.  Home and relationship well-being.  Sexual activity.  Eating habits.  Work and work Statistician.  Method of birth control.  Menstrual cycle.  Pregnancy history.  Screening You may have the following tests or measurements:  Height, weight, and BMI.  Diabetes screening. This is done by checking your blood sugar (glucose) after you have not eaten for a while (fasting).  Blood pressure.  Lipid and cholesterol levels. These may be checked every 5 years starting at age 66.  Skin check.  Hepatitis C blood test.  Hepatitis B blood test.  Sexually transmitted disease (STD) testing.  BRCA-related cancer screening. This may be done if you have a family history of breast, ovarian, tubal, or peritoneal cancers.  Pelvic exam and Pap test. This may be done every 3 years starting at age 40. Starting at age 59, this may be done every 5  years if you have a Pap test in combination with an HPV test.  Discuss your test results, treatment options, and if necessary, the need for more tests with your health care provider. Vaccines Your health care provider may recommend certain vaccines, such as:  Influenza vaccine. This is recommended every year.  Tetanus, diphtheria, and acellular pertussis (Tdap, Td) vaccine. You may need a Td booster every 10 years.  Varicella vaccine. You may need this if you have not been vaccinated.  HPV vaccine. If you are 69 or younger, you may need three doses over 6 months.  Measles, mumps, and rubella (MMR) vaccine. You may need at least one dose of MMR. You may also need a second dose.  Pneumococcal 13-valent conjugate (PCV13) vaccine. You may need this if you have certain conditions and were not previously vaccinated.  Pneumococcal polysaccharide (PPSV23) vaccine. You may need one or two doses if you smoke cigarettes or if you have certain conditions.  Meningococcal vaccine. One dose is recommended if you are age 27-21 years and a first-year college student living in a residence hall, or if you have one of several medical conditions. You may also need additional booster doses.  Hepatitis A vaccine. You may need this if you have certain conditions or if you travel or work in places where you may be exposed to hepatitis A.  Hepatitis B vaccine. You may need this if you have certain conditions or if you travel or work in places where you may be exposed to hepatitis B.  Haemophilus influenzae type b (Hib) vaccine. You may need this if  you have certain risk factors.  Talk to your health care provider about which screenings and vaccines you need and how often you need them. This information is not intended to replace advice given to you by your health care provider. Make sure you discuss any questions you have with your health care provider. Document Released: 10/22/2001 Document Revised: 05/15/2016  Document Reviewed: 06/27/2015 Elsevier Interactive Patient Education  [employer].

## 2018-08-25 ENCOUNTER — Ambulatory Visit: Payer: Self-pay | Admitting: *Deleted

## 2018-08-25 ENCOUNTER — Encounter: Payer: Self-pay | Admitting: Internal Medicine

## 2018-08-25 LAB — HIV ANTIBODY (ROUTINE TESTING W REFLEX): HIV: NONREACTIVE

## 2018-08-25 LAB — RPR: RPR Ser Ql: NONREACTIVE

## 2018-08-25 NOTE — Telephone Encounter (Signed)
[facility] from [facility] Demaray sent at 08/25/2018 3:30 PM EST   Summary: Lab questions   Pt called and said she would like to know what she was tested for when her labs were done yesterday         Left message for pt to return call to office

## 2018-08-25 NOTE — Telephone Encounter (Signed)
  Reason for Disposition . Caller requesting lab results  Answer Assessment - Initial Assessment Questions 1. REASON FOR CALL or QUESTION: "What is your reason for calling today?" or "How can I best help you?" or "What question do you have that I can help answer?"     Pt wanting lab results= results given pt picked up the Flagyl yesterday.  Protocols used: PCP CALL - NO TRIAGE-A-AH, INFORMATION ONLY CALL-A-AH

## 2018-08-26 LAB — CYTOLOGY - PAP
CHLAMYDIA, DNA PROBE: NEGATIVE
DIAGNOSIS: NEGATIVE
HPV: DETECTED — AB
Neisseria Gonorrhea: NEGATIVE

## 2018-09-08 DIAGNOSIS — R87810 Cervical high risk human papillomavirus (HPV) DNA test positive: Secondary | ICD-10-CM | POA: Diagnosis not present

## 2018-09-08 DIAGNOSIS — Z3009 Encounter for other general counseling and advice on contraception: Secondary | ICD-10-CM | POA: Diagnosis not present

## 2018-09-08 DIAGNOSIS — N926 Irregular menstruation, unspecified: Secondary | ICD-10-CM | POA: Diagnosis not present

## 2018-09-29 ENCOUNTER — Ambulatory Visit: Payer: BLUE CROSS/BLUE SHIELD

## 2018-11-24 ENCOUNTER — Ambulatory Visit: Payer: BLUE CROSS/BLUE SHIELD | Admitting: Internal Medicine

## 2018-11-27 DIAGNOSIS — N939 Abnormal uterine and vaginal bleeding, unspecified: Secondary | ICD-10-CM | POA: Diagnosis not present

## 2018-11-28 DIAGNOSIS — F3181 Bipolar II disorder: Secondary | ICD-10-CM | POA: Diagnosis not present

## 2018-12-16 DIAGNOSIS — Z23 Encounter for immunization: Secondary | ICD-10-CM | POA: Diagnosis not present

## 2018-12-16 DIAGNOSIS — R102 Pelvic and perineal pain: Secondary | ICD-10-CM | POA: Diagnosis not present

## 2018-12-16 DIAGNOSIS — Z3202 Encounter for pregnancy test, result negative: Secondary | ICD-10-CM | POA: Diagnosis not present

## 2018-12-16 DIAGNOSIS — N941 Unspecified dyspareunia: Secondary | ICD-10-CM | POA: Diagnosis not present

## 2018-12-26 DIAGNOSIS — F411 Generalized anxiety disorder: Secondary | ICD-10-CM | POA: Diagnosis not present

## 2019-02-15 DIAGNOSIS — Z23 Encounter for immunization: Secondary | ICD-10-CM | POA: Diagnosis not present

## 2019-03-11 DIAGNOSIS — Z3202 Encounter for pregnancy test, result negative: Secondary | ICD-10-CM | POA: Diagnosis not present

## 2019-03-11 DIAGNOSIS — D509 Iron deficiency anemia, unspecified: Secondary | ICD-10-CM | POA: Diagnosis not present

## 2019-03-11 DIAGNOSIS — Z113 Encounter for screening for infections with a predominantly sexual mode of transmission: Secondary | ICD-10-CM | POA: Diagnosis not present

## 2019-03-11 DIAGNOSIS — N939 Abnormal uterine and vaginal bleeding, unspecified: Secondary | ICD-10-CM | POA: Diagnosis not present

## 2019-07-10 DIAGNOSIS — F3181 Bipolar II disorder: Secondary | ICD-10-CM | POA: Diagnosis not present

## 2019-08-21 DIAGNOSIS — F411 Generalized anxiety disorder: Secondary | ICD-10-CM | POA: Diagnosis not present

## 2019-08-24 DIAGNOSIS — N898 Other specified noninflammatory disorders of vagina: Secondary | ICD-10-CM | POA: Diagnosis not present

## 2019-08-24 DIAGNOSIS — N760 Acute vaginitis: Secondary | ICD-10-CM | POA: Diagnosis not present

## 2019-08-24 DIAGNOSIS — Z3169 Encounter for other general counseling and advice on procreation: Secondary | ICD-10-CM | POA: Diagnosis not present

## 2019-08-24 DIAGNOSIS — Z113 Encounter for screening for infections with a predominantly sexual mode of transmission: Secondary | ICD-10-CM | POA: Diagnosis not present

## 2019-12-19 ENCOUNTER — Emergency Department (HOSPITAL_COMMUNITY)
Admission: EM | Admit: 2019-12-19 | Discharge: 2019-12-19 | Disposition: A | Discharge status: Home / Self Care | Payer: Self-pay | Attending: Emergency Medicine | Admitting: Emergency Medicine

## 2019-12-19 ENCOUNTER — Emergency Department (HOSPITAL_COMMUNITY): Payer: Self-pay

## 2019-12-19 DIAGNOSIS — Z79899 Other long term (current) drug therapy: Secondary | ICD-10-CM | POA: Insufficient documentation

## 2019-12-19 DIAGNOSIS — O039 Complete or unspecified spontaneous abortion without complication: Secondary | ICD-10-CM | POA: Insufficient documentation

## 2019-12-19 DIAGNOSIS — O99331 Smoking (tobacco) complicating pregnancy, first trimester: Secondary | ICD-10-CM | POA: Insufficient documentation

## 2019-12-19 DIAGNOSIS — R102 Pelvic and perineal pain: Secondary | ICD-10-CM | POA: Insufficient documentation

## 2019-12-19 DIAGNOSIS — F17210 Nicotine dependence, cigarettes, uncomplicated: Secondary | ICD-10-CM | POA: Insufficient documentation

## 2019-12-19 DIAGNOSIS — Z3A01 Less than 8 weeks gestation of pregnancy: Secondary | ICD-10-CM | POA: Insufficient documentation

## 2019-12-19 DIAGNOSIS — N939 Abnormal uterine and vaginal bleeding, unspecified: Secondary | ICD-10-CM

## 2019-12-19 LAB — WET PREP, GENITAL
Clue Cells Wet Prep HPF POC: NONE SEEN
Sperm: NONE SEEN
Trich, Wet Prep: NONE SEEN
Yeast Wet Prep HPF POC: NONE SEEN

## 2019-12-19 LAB — HCG, QUANTITATIVE, PREGNANCY: hCG, Beta Chain, Quant, S: 6 m[IU]/mL — ABNORMAL HIGH (ref <5)

## 2019-12-19 LAB — CBC WITH DIFFERENTIAL/PLATELET
Abs Immature Granulocytes: 0.04 10*3/uL (ref 0.00–0.07)
Basophils Absolute: 0.1 10*3/uL (ref 0.0–0.1)
Basophils Relative: 1 %
Eosinophils Absolute: 0.2 10*3/uL (ref 0.0–0.5)
Eosinophils Relative: 2 %
HCT: 37.4 % (ref 36.0–46.0)
Hemoglobin: 12.6 g/dL (ref 12.0–15.0)
Immature Granulocytes: 0 %
Lymphocytes Relative: 18 %
Lymphs Abs: 2.2 10*3/uL (ref 0.7–4.0)
MCH: 32.2 pg (ref 26.0–34.0)
MCHC: 33.7 g/dL (ref 30.0–36.0)
MCV: 95.7 fL (ref 80.0–100.0)
Monocytes Absolute: 0.9 10*3/uL (ref 0.1–1.0)
Monocytes Relative: 7 %
Neutro Abs: 8.7 10*3/uL — ABNORMAL HIGH (ref 1.7–7.7)
Neutrophils Relative %: 72 %
Platelets: 321 10*3/uL (ref 150–400)
RBC: 3.91 MIL/uL (ref 3.87–5.11)
RDW: 12.8 % (ref 11.5–15.5)
WBC: 12.1 10*3/uL — ABNORMAL HIGH (ref 4.0–10.5)
nRBC: 0 % (ref 0.0–0.2)

## 2019-12-19 LAB — COMPREHENSIVE METABOLIC PANEL
ALT: 11 U/L (ref 0–44)
AST: 17 U/L (ref 15–41)
Albumin: 4.6 g/dL (ref 3.5–5.0)
Alkaline Phosphatase: 51 U/L (ref 38–126)
Anion gap: 7 (ref 5–15)
BUN: 9 mg/dL (ref 6–20)
CO2: 26 mmol/L (ref 22–32)
Calcium: 9.3 mg/dL (ref 8.9–10.3)
Chloride: 109 mmol/L (ref 98–111)
Creatinine, Ser: 0.75 mg/dL (ref 0.44–1.00)
GFR calc Af Amer: >60 mL/min (ref >60)
GFR calc non Af Amer: >60 mL/min (ref >60)
Glucose, Bld: 90 mg/dL (ref 70–99)
Potassium: 4.1 mmol/L (ref 3.5–5.1)
Sodium: 142 mmol/L (ref 135–145)
Total Bilirubin: 0.7 mg/dL (ref 0.3–1.2)
Total Protein: 7.4 g/dL (ref 6.5–8.1)

## 2019-12-19 LAB — ABO/RH: ABO/RH(D): O POS

## 2019-12-19 MED ORDER — ACETAMINOPHEN 325 MG PO TABS
650.0000 mg | ORAL_TABLET | Freq: Once | ORAL | Status: AC
  Administered 2019-12-19: 11:00:00 650 mg via ORAL
  Filled 2019-12-19: qty 2

## 2019-12-19 NOTE — ED Triage Notes (Signed)
Pt presents with c/o vaginal bleeding that started last night. Pt reports she is 4.[redacted] weeks pregnant. Pt reports she has not noticed any clots but has seen some tissue.

## 2019-12-19 NOTE — ED Provider Notes (Signed)
[facility] DEPT Provider Note   CSN: [id number] Arrival date & time: 12/19/19  1010  History Chief Complaint  Patient presents with  . Vaginal Bleeding    Loretta Baker is a 24 y.o. female [id number] who presents with vaginal bleeding. She states she's 4.[redacted] weeks pregnant. Her LMP was March 12 and she states she has had regular periods. She missed her period and took 5 pregnancy tests which were all positive. She is going to see OBGYN at [facility] but has not made an appointment yet. Her previous miscarriage was around 6.5 weeks last February. She states that the bleeding has been similar to a period. She is concerned that something hormonal or structural is wrong with her because this is the 2nd miscarriage.   HPI   Past Medical History:  Diagnosis Date  . Allergy   . Bipolar disorder [facility])     Patient Active Problem List   Diagnosis Date Noted  . Routine general medical examination at a health care facility 08/24/2018  . Vaginal discharge 08/24/2018  . Cervical cancer screening 08/24/2018  . Need for HPV vaccine 08/24/2018  . Bacterial vaginitis 08/24/2018  . Marijuana abuse 07/23/2018  . Tobacco abuse 07/23/2018  . Bipolar 1 disorder ([place]) 07/23/2018  . Numbness and tingling of upper and lower extremities of both sides 07/23/2018  . GAD (generalized anxiety disorder) 12/14/2014    History reviewed. No pertinent surgical history.   OB History    Gravida  1   Para      Term      Preterm      AB      Living        SAB      TAB      Ectopic      Multiple      Live Births              Family History  Problem Relation Age of Onset  . Arthritis Other   . Alcohol abuse Neg Hx   . Cancer Neg Hx   . Depression Neg Hx   . Diabetes Neg Hx   . Early death Neg Hx   . Heart disease Neg Hx   . Hyperlipidemia Neg Hx   . Hypertension Neg Hx   . Stroke Neg Hx     Social History   Tobacco Use  . Smoking status:  Current Every Day Smoker    Types: Cigarettes  . Smokeless tobacco: Never Used  Substance Use Topics  . Alcohol use: Yes    Alcohol/week: 1.0 standard drinks    Types: 1 Shots of liquor per week  . Drug use: Yes    Types: Marijuana    Home Medications Prior to Admission medications   Medication Sig Start Date End Date Taking? Authorizing Provider  clonazePAM (KLONOPIN) 0.5 MG tablet Take 0.5 mg by mouth 3 (three) times daily. 08/01/18   [provider]  lamoTRIgine (LAMICTAL) 25 MG tablet Take 1 tablet (25 mg total) by mouth 2 (two) times daily. 07/23/18   Janith Lima, MD  QUEtiapine (SEROQUEL) 100 MG tablet TAKE 1/2 A TABLET EVERY NIGHT FOR 7 DAYS, THEN ONE EVERY NIGHT 08/01/18   [provider]    Allergies    Patient has no known allergies.  Review of Systems   Review of Systems  Gastrointestinal: Negative for abdominal pain, nausea and vomiting.  Genitourinary: Positive for pelvic pain, vaginal bleeding and vaginal discharge. Negative for  menstrual problem.  All other systems reviewed and are negative.   Physical Exam Updated Vital Signs BP 114/70 (BP Location: Left Arm)   Pulse 93   Temp 98.2 F (36.8 C) (Oral)   Resp 18   LMP 11/19/2019 (Approximate)   SpO2 98%   Physical Exam Vitals and nursing note reviewed.  Constitutional:      General: She is not in acute distress.    Appearance: Normal appearance. She is well-developed. She is not ill-appearing.  HENT:     Head: Normocephalic and atraumatic.  Eyes:     General: No scleral icterus.       Right eye: No discharge.        Left eye: No discharge.     Conjunctiva/sclera: Conjunctivae normal.     Pupils: Pupils are equal, round, and reactive to light.  Cardiovascular:     Rate and Rhythm: Normal rate and regular rhythm.  Pulmonary:     Effort: Pulmonary effort is normal. No respiratory distress.     Breath sounds: Normal breath sounds.  Abdominal:     General: There is no  distension.     Palpations: Abdomen is soft.     Tenderness: There is abdominal tenderness (mild diffuse pelvic tenderness).  Genitourinary:    Comments: Pelvic: No inguinal lymphadenopathy or inguinal hernia noted. Normal external genitalia. No pain with speculum insertion. Closed cervical os with normal appearance - no rash or lesions. Moderate amount of blood in the vaginal vault. Chaperone Lucius Conn, RN) present during exam.   Musculoskeletal:     Cervical back: Normal range of motion.  Skin:    General: Skin is warm and dry.  Neurological:     Mental Status: She is alert and oriented to person, place, and time.  Psychiatric:        Behavior: Behavior normal.     ED Results / Procedures / Treatments   Labs (all labs ordered are listed, but only abnormal results are displayed) Labs Reviewed  WET PREP, GENITAL - Abnormal; Notable for the following components:      Result Value   WBC, Wet Prep HPF POC FEW (*)    All other components within normal limits  CBC WITH DIFFERENTIAL/PLATELET - Abnormal; Notable for the following components:   WBC 12.1 (*)    Neutro Abs 8.7 (*)    All other components within normal limits  HCG, QUANTITATIVE, PREGNANCY - Abnormal; Notable for the following components:   hCG, Beta Chain, Quant, S 6 (*)    All other components within normal limits  COMPREHENSIVE METABOLIC PANEL  ABO/RH  GC/CHLAMYDIA PROBE AMP ([place]) NOT AT [facility]    EKG None  Radiology US OB LESS THAN 14 WEEKS WITH OB TRANSVAGINAL  Result Date: 12/19/2019 CLINICAL DATA:  Vaginal bleeding. Quantitative beta HCG 6. Estimated gestational age per LMP 4 weeks 2 days. EXAM: OBSTETRIC <14 WK ULTRASOUND TECHNIQUE: Transabdominal ultrasound was performed for evaluation of the gestation as well as the maternal uterus and adnexal regions. COMPARISON:  None. FINDINGS: Intrauterine gestational sac: Not visualized. Yolk sac:  Not visualized. Embryo:  Not visualized. Cardiac Activity: Not  visualized. Heart Rate: Not visualized.  Bpm MSD:  Not applicable CRL:   Not applicable Subchorionic hemorrhage:  Not applicable. Maternal uterus/adnexae: Uterus is normal size, shape and position. Endometrium is normal and measures approximately 7 mm in thickness. Ovaries are normal size, shape and position. There is a 2.3 cm left ovarian cyst which may be a corpus luteal cyst.  No free pelvic fluid. IMPRESSION: No intrauterine gestational sac, yolk sac, fetal pole, or cardiac activity visualized. Differential considerations include intrauterine gestation too early to be sonographically visualized, spontaneous abortion, or ectopic pregnancy. Consider follow-up ultrasound in 14 days and serial quantitative beta HCG follow-up. Electronically Signed   By: Elberta Fortis M.D.   On: 12/19/2019 13:29    Procedures Procedures (including critical care time)  Medications Ordered in ED Medications  acetaminophen (TYLENOL) tablet 650 mg (has no administration in time range)    ED Course  I have reviewed the triage vital signs and the nursing notes.  Pertinent labs & imaging results that were available during my care of the patient were reviewed by me and considered in my medical decision making (see chart for details).  24 year old female presents with positive home pregnancy test and vaginal bleeding.  She states she is approximately 4-1/[redacted] weeks pregnant.  Abdomen is soft and minimally tender in the pelvic area.  Pelvic exam was performed and shows moderate amount of bleeding with a closed cervical os.  Will obtain labs, ABO Rh, hCG levels, pelvic ultrasound  Labs are overall unremarkable.  She is O+.  hCG level is 6.  Pelvic ultrasound is negative.  I think this likely represents miscarriage.  Discussed with patient.  She will arrange follow-up with OB/GYN for further evaluation.  MDM Rules/Calculators/A&P                       Final Clinical Impression(s) / ED Diagnoses Final diagnoses:  Miscarriage     Rx / DC Orders ED Discharge Orders    None       Bethel Born, PA-C 12/19/19 1458    Bethann Berkshire, MD 12/20/19 1031

## 2019-12-19 NOTE — Discharge Instructions (Signed)
Take Ibuprofen or Tylenol for pain and use heating pads Please follow up with OBGYN

## 2019-12-20 LAB — GC/CHLAMYDIA PROBE AMP (~~LOC~~) NOT AT ARMC
Chlamydia: NEGATIVE
Comment: NEGATIVE
Comment: NORMAL
Neisseria Gonorrhea: NEGATIVE

## 2020-07-25 IMAGING — US US OB < 14 WEEKS - US OB TV
1 series · 13 of 28 positions shown · non-contrast
Comparison: None.

CLINICAL DATA: Vaginal bleeding. Quantitative beta HCG 6. Estimated
gestational age per LMP 4 weeks 2 days.

EXAM:
OBSTETRIC <14 WK ULTRASOUND
TECHNIQUE: Transabdominal ultrasound was performed for evaluation of the
gestation as well as the maternal uterus and adnexal regions.

[Series 1: us ob < 14 weeks - us ob tv · 13 of 77 slices shown]
[im 3/77]
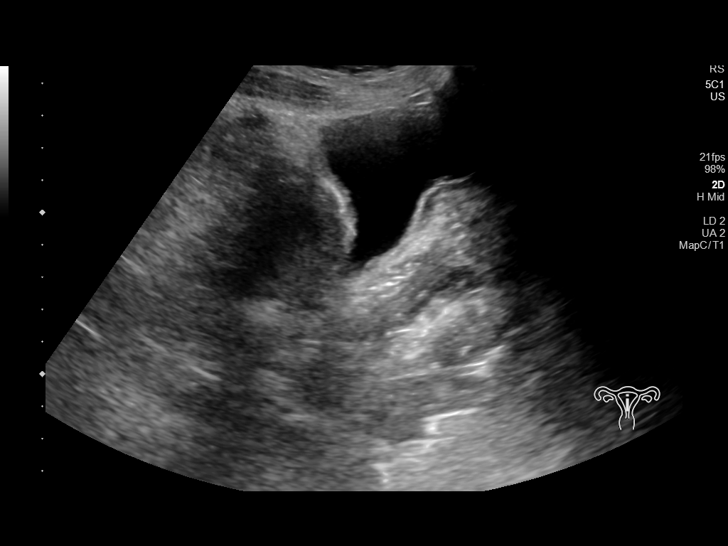
[im 9/77]
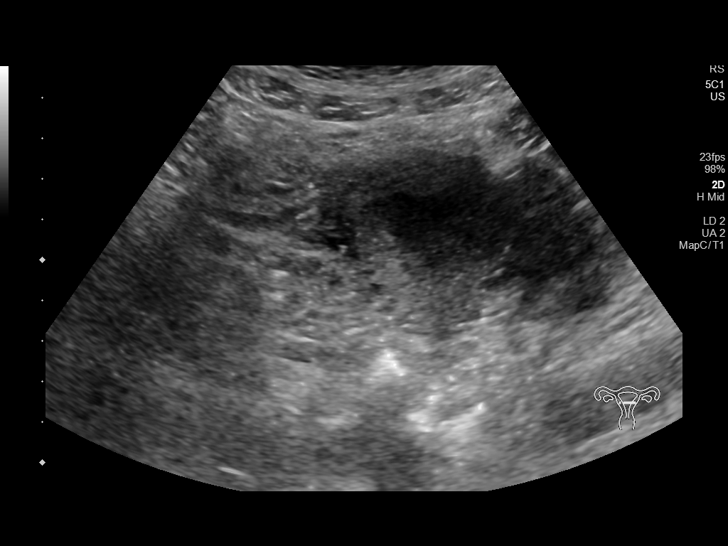
[im 15/77]
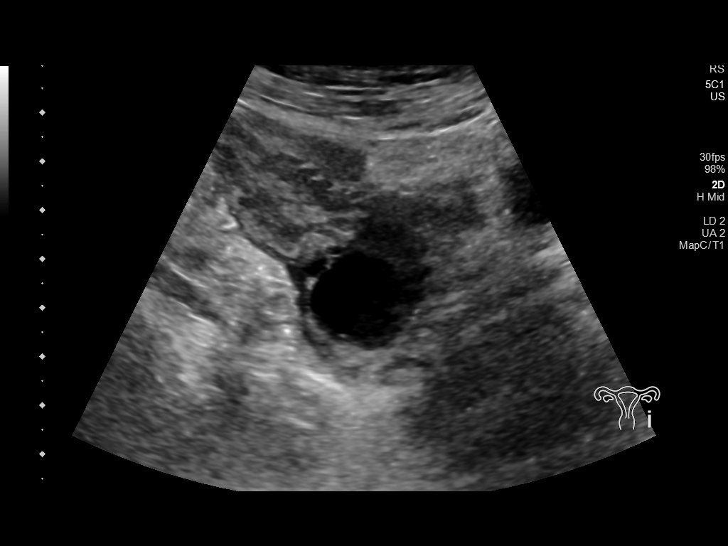
[im 20/77]
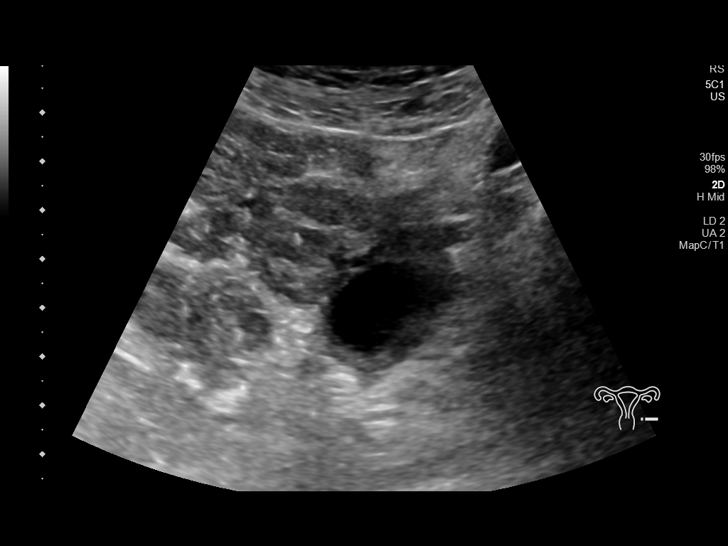
[im 26/77]
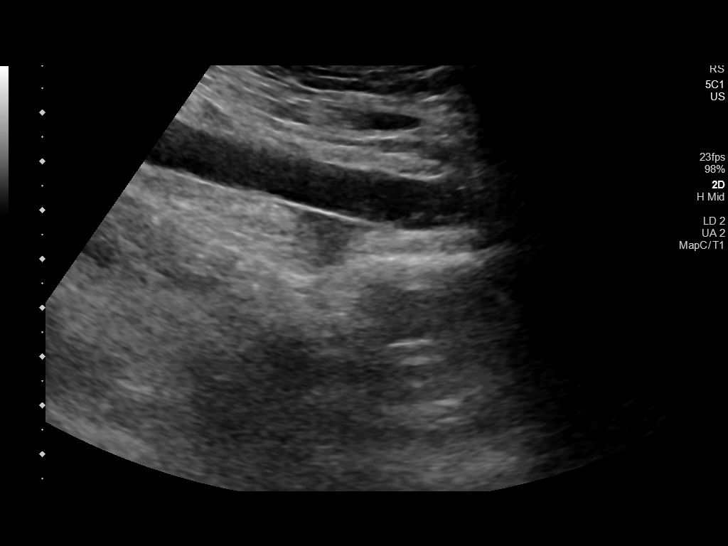
[im 31/77]
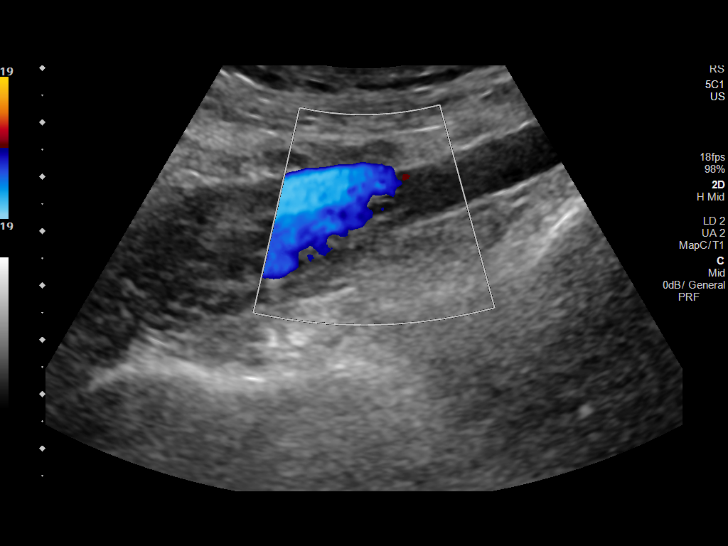
[im 40/77]
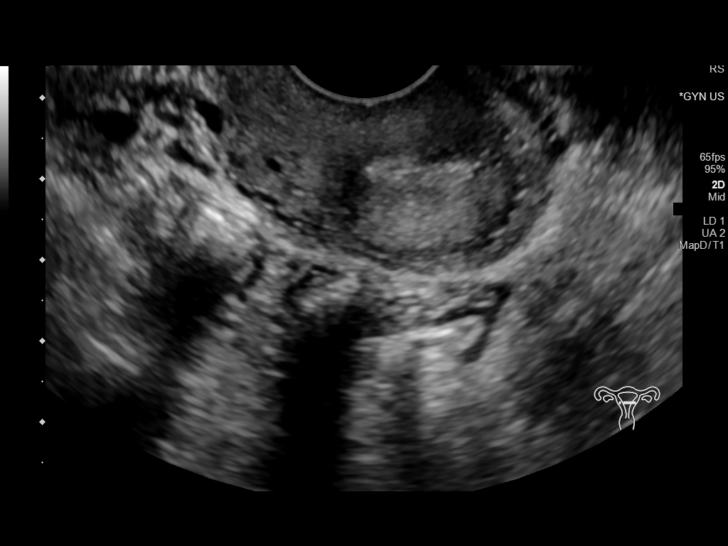
[im 46/77]
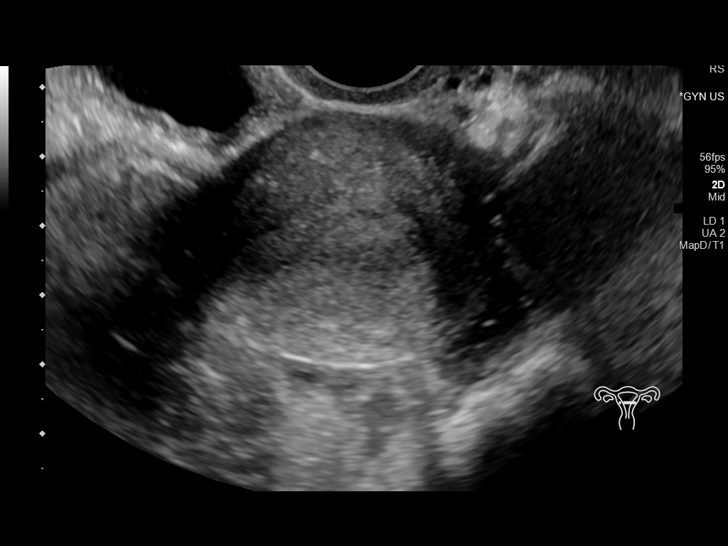
[im 51/77]
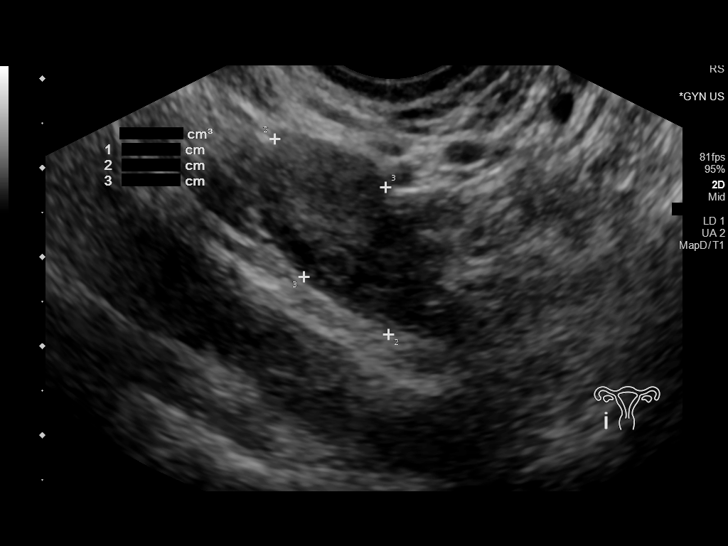
[im 57/77]
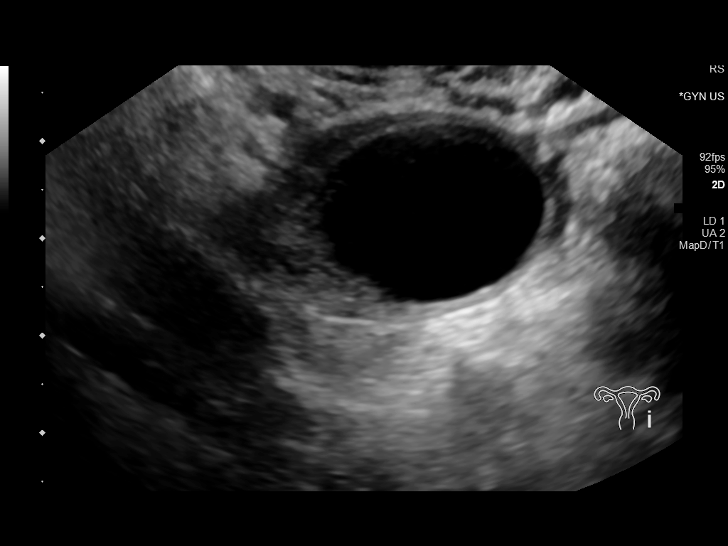
[im 62/77]
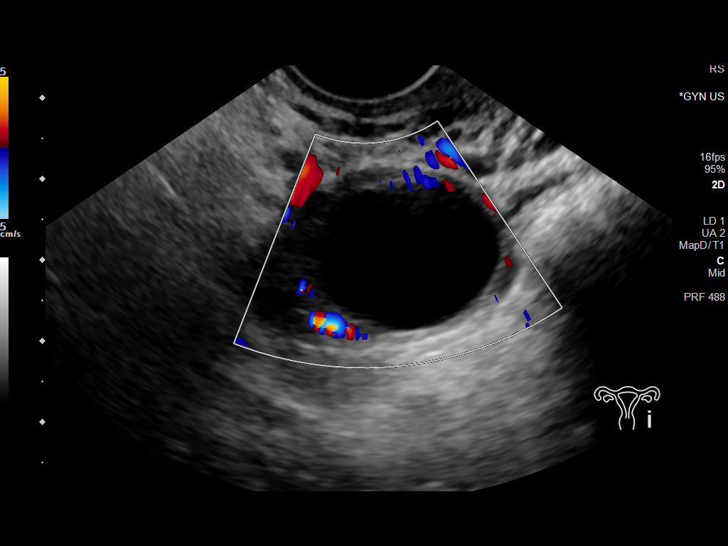
[im 68/77]
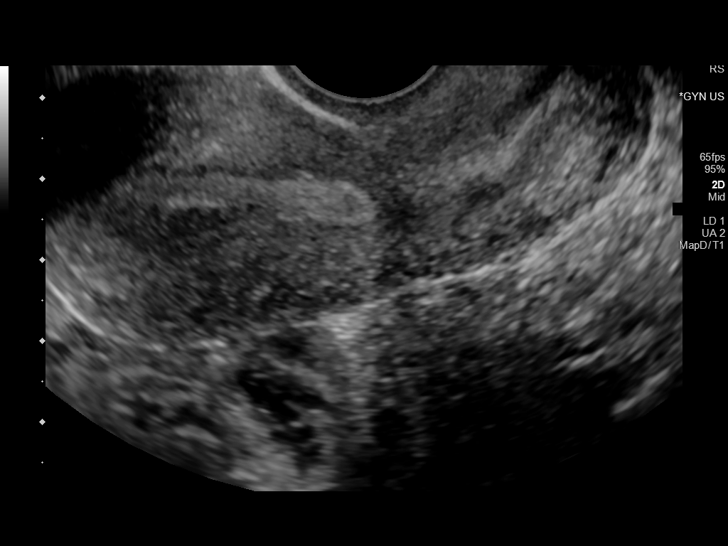
[im 74/77]
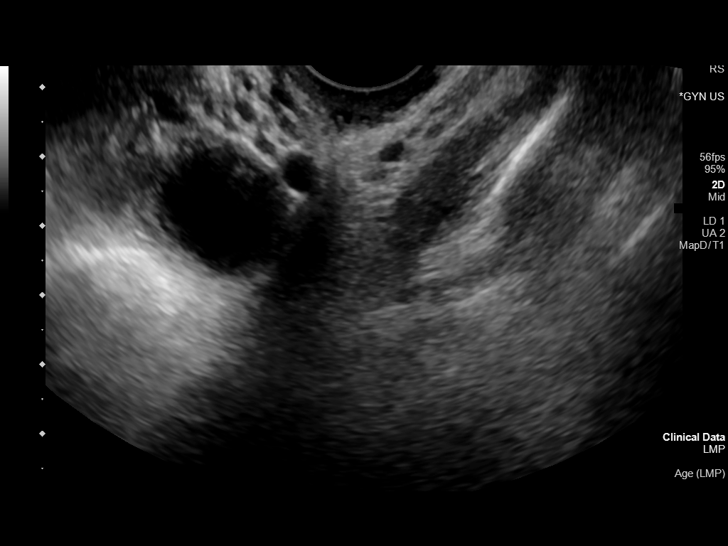

[13 of 28 positions shown; findings below may reference images not displayed]

FINDINGS: Intrauterine gestational sac: Not visualized.

Yolk sac:  Not visualized.

Embryo:  Not visualized.

Cardiac Activity: Not visualized.

Heart Rate: Not visualized.  Bpm

MSD:  Not applicable

CRL:   Not applicable

Subchorionic hemorrhage:  Not applicable.

Maternal uterus/adnexae: Uterus is normal size, shape and position.
Endometrium is normal and measures approximately 7 mm in thickness.
Ovaries are normal size, shape and position. There is a 2.3 cm left
ovarian cyst which may be a corpus luteal cyst. No free pelvic
fluid.
IMPRESSION: No intrauterine gestational sac, yolk sac, fetal pole, or cardiac
activity visualized. Differential considerations include
intrauterine gestation too early to be sonographically visualized,
spontaneous abortion, or ectopic pregnancy. Consider follow-up
ultrasound in 14 days and serial quantitative beta HCG follow-up.

## 2020-08-06 ENCOUNTER — Encounter (HOSPITAL_COMMUNITY): Payer: Self-pay | Admitting: *Deleted

## 2020-08-06 ENCOUNTER — Other Ambulatory Visit: Payer: Self-pay

## 2020-08-06 ENCOUNTER — Inpatient Hospital Stay (HOSPITAL_COMMUNITY): Payer: PRIVATE HEALTH INSURANCE

## 2020-08-06 ENCOUNTER — Inpatient Hospital Stay (HOSPITAL_COMMUNITY)
Admission: EM | Admit: 2020-08-06 | Discharge: 2020-08-06 | Disposition: A | Discharge status: Home / Self Care | Payer: PRIVATE HEALTH INSURANCE | Attending: Family Medicine | Admitting: Family Medicine

## 2020-08-06 DIAGNOSIS — F332 Major depressive disorder, recurrent severe without psychotic features: Secondary | ICD-10-CM | POA: Diagnosis not present

## 2020-08-06 DIAGNOSIS — O208 Other hemorrhage in early pregnancy: Secondary | ICD-10-CM | POA: Insufficient documentation

## 2020-08-06 DIAGNOSIS — O418X10 Other specified disorders of amniotic fluid and membranes, first trimester, not applicable or unspecified: Secondary | ICD-10-CM

## 2020-08-06 DIAGNOSIS — O99331 Smoking (tobacco) complicating pregnancy, first trimester: Secondary | ICD-10-CM | POA: Insufficient documentation

## 2020-08-06 DIAGNOSIS — O99341 Other mental disorders complicating pregnancy, first trimester: Secondary | ICD-10-CM | POA: Insufficient documentation

## 2020-08-06 DIAGNOSIS — Z3A01 Less than 8 weeks gestation of pregnancy: Secondary | ICD-10-CM | POA: Insufficient documentation

## 2020-08-06 DIAGNOSIS — F17210 Nicotine dependence, cigarettes, uncomplicated: Secondary | ICD-10-CM | POA: Insufficient documentation

## 2020-08-06 DIAGNOSIS — N939 Abnormal uterine and vaginal bleeding, unspecified: Secondary | ICD-10-CM

## 2020-08-06 DIAGNOSIS — Z9189 Other specified personal risk factors, not elsewhere classified: Secondary | ICD-10-CM

## 2020-08-06 HISTORY — DX: Depression, unspecified: F32.A

## 2020-08-06 HISTORY — DX: Anxiety disorder, unspecified: F41.9

## 2020-08-06 HISTORY — DX: Borderline personality disorder: F60.3

## 2020-08-06 HISTORY — DX: Anorexia: R63.0

## 2020-08-06 LAB — URINALYSIS, ROUTINE W REFLEX MICROSCOPIC
Bilirubin Urine: NEGATIVE
Glucose, UA: NEGATIVE mg/dL
Hgb urine dipstick: NEGATIVE
Ketones, ur: NEGATIVE mg/dL
Leukocytes,Ua: NEGATIVE
Nitrite: NEGATIVE
Protein, ur: NEGATIVE mg/dL
Specific Gravity, Urine: 1.01 (ref 1.005–1.030)
pH: 8 (ref 5.0–8.0)

## 2020-08-06 LAB — RAPID URINE DRUG SCREEN, HOSP PERFORMED
Amphetamines: NOT DETECTED
Barbiturates: NOT DETECTED
Benzodiazepines: NOT DETECTED
Cocaine: NOT DETECTED
Opiates: NOT DETECTED
Tetrahydrocannabinol: POSITIVE — AB

## 2020-08-06 LAB — WET PREP, GENITAL
Sperm: NONE SEEN
Trich, Wet Prep: NONE SEEN
Yeast Wet Prep HPF POC: NONE SEEN

## 2020-08-06 LAB — CBC
HCT: 34.3 % — ABNORMAL LOW (ref 36.0–46.0)
Hemoglobin: 11.6 g/dL — ABNORMAL LOW (ref 12.0–15.0)
MCH: 32 pg (ref 26.0–34.0)
MCHC: 33.8 g/dL (ref 30.0–36.0)
MCV: 94.5 fL (ref 80.0–100.0)
Platelets: 294 10*3/uL (ref 150–400)
RBC: 3.63 MIL/uL — ABNORMAL LOW (ref 3.87–5.11)
RDW: 12.4 % (ref 11.5–15.5)
WBC: 10.9 10*3/uL — ABNORMAL HIGH (ref 4.0–10.5)
nRBC: 0 % (ref 0.0–0.2)

## 2020-08-06 LAB — I-STAT BETA HCG BLOOD, ED (MC, WL, AP ONLY): I-stat hCG, quantitative: >2000 m[IU]/mL — ABNORMAL HIGH (ref <5)

## 2020-08-06 LAB — HCG, QUANTITATIVE, PREGNANCY: hCG, Beta Chain, Quant, S: 85021 m[IU]/mL — ABNORMAL HIGH (ref <5)

## 2020-08-06 NOTE — Progress Notes (Signed)
I was called to assist with Behavioral Health's plan and recommendation for IVC.  This recommendation is based upon Loretta Baker assessment in which he documents the patient voiced suicidal ideation without plan, self induced vomiting and cutting, and current social stressors.  I spoke with Loretta Baker (with [facility]) in regards to this decision.  Patient agreed that she felt she needed inpatient behavioral health intervention but did not agree to go immediately.    My encounter with the patient initially was to reassure her obstetrically with U/S confirming viable IUP and subchorionic hemorrhage as likely cause of her spotting.  On questioning about her suicidal ideation, the patient reports that she does not currently, nor recently has she had, thoughts of harming herself or others.  She reported the same (no current suicidal thoughts) to MAU provider as well.  She reports a history of these thoughts but not for some time.  She reports she certainly has the best of intentions in improving her mental health and physical health given her new pregnancy.   Given the patient's absence of suicidal thoughts at this time, I feel comfortable with close and intensive outpatient management.  We agreed with plans for her sister to come and stay with her tonight to provide support as she missed a friend's funeral while in the MAU who passed unexpectedly on 11/24.  She has an appointment for confirmation in our office tomorrow which she looks forward to attending.  Social work has arranged for her to have an initial telemed visit (90 minute assessment) with [facility] tomorrow at [place].  She looks forward to developing a relationship with a new psychiatrist, finally getting a therapist and having someone help with her medication management.    Patient was discharged home.   Loretta Honour, DO

## 2020-08-06 NOTE — MAU Note (Signed)
telepsych in progress 
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
## 2020-08-06 NOTE — MAU Note (Signed)
Pt reports last night she did have thoughts of hurting herself but she does not have a plan

## 2020-08-06 NOTE — MAU Note (Signed)
Loretta Baker is a 24 y.o. at [redacted]w[redacted]d here in MAU reporting: this AM when she went to the bathroom saw some brown discharge. Also ? Clot in the toilet. Denies bright red bleeding. No pain.   LMP: 06/09/20  Onset of complaint: this AM  Pain score: 0/10  Vitals:   08/06/20 0934 08/06/20 1050  BP: 119/66 108/66  Pulse: (!) 104 81  Resp: 18 16  Temp: 98.5 F (36.9 C) 99 F (37.2 C)  SpO2: 97% 98%     Lab orders placed from triage: UA

## 2020-08-06 NOTE — Discharge Instructions (Signed)
Vaginal Bleeding During Pregnancy, First Trimester  A small amount of bleeding (spotting) from the vagina is common during early pregnancy. Sometimes the bleeding is normal and does not cause problems. At other times, though, bleeding may be a sign of something serious. Tell your doctor about any bleeding from your vagina right away. Follow these instructions at home: Activity  Follow your doctor's instructions about how active you can be.  If needed, make plans for someone to help with your normal activities.  Do not have sex or orgasms until your doctor says that this is safe. General instructions  Take over-the-counter and prescription medicines only as told by your doctor.  Watch your condition for any changes.  Write down: ? The number of pads you use each day. ? How often you change pads. ? How soaked (saturated) your pads are.  Do not use tampons.  Do not douche.  If you pass any tissue from your vagina, save it to show to your doctor.  Keep all follow-up visits as told by your doctor. This is important. Contact a doctor if:  You have vaginal bleeding at any time while you are pregnant.  You have cramps.  You have a fever. Get help right away if:  You have very bad cramps in your back or belly (abdomen).  You pass large clots or a lot of tissue from your vagina.  Your bleeding gets worse.  You feel light-headed.  You feel weak.  You pass out (faint).  You have chills.  You are leaking fluid from your vagina.  You have a gush of fluid from your vagina. Summary  Sometimes vaginal bleeding during pregnancy is normal and does not cause problems. At other times, bleeding may be a sign of something serious.  Tell your doctor about any bleeding from your vagina right away.  Follow your doctor's instructions about how active you can be. You may need someone to help you with your normal activities. This information is not intended to replace advice given to  you by your health care provider. Make sure you discuss any questions you have with your health care provider. Document Revised: 12/15/2018 Document Reviewed: 11/27/2016 Elsevier Patient Education  2020 Elsevier Inc. Subchorionic Hematoma  A subchorionic hematoma is a gathering of blood between the outer wall of the embryo (chorion) and the inner wall of the womb (uterus). This condition can cause vaginal bleeding. If they cause little or no vaginal bleeding, early small hematomas usually shrink on their own and do not affect your baby or pregnancy. When bleeding starts later in pregnancy, or if the hematoma is larger or occurs in older pregnant women, the condition may be more serious. Larger hematomas may get bigger, which increases the chances of miscarriage. This condition also increases the risk of:  Premature separation of the placenta from the uterus.  Premature (preterm) labor.  Stillbirth. What are the causes? The exact cause of this condition is not known. It occurs when blood is trapped between the placenta and the uterine wall because the placenta has separated from the original site of implantation. What increases the risk? You are more likely to develop this condition if:  You were treated with fertility medicines.  You conceived through in vitro fertilization (IVF). What are the signs or symptoms? Symptoms of this condition include:  Vaginal spotting or bleeding.  Contractions of the uterus. These cause abdominal pain. Sometimes you may have no symptoms and the bleeding may only be seen when ultrasound images  are taken (transvaginal ultrasound). How is this diagnosed? This condition is diagnosed based on a physical exam. This includes a pelvic exam. You may also have other tests, including:  Blood tests.  Urine tests.  Ultrasound of the abdomen. How is this treated? Treatment for this condition can vary. Treatment may include:  Watchful waiting. You will be  monitored closely for any changes in bleeding. During this stage: ? The hematoma may be reabsorbed by the body. ? The hematoma may separate the fluid-filled space containing the embryo (gestational sac) from the wall of the womb (endometrium).  Medicines.  Activity restriction. This may be needed until the bleeding stops. Follow these instructions at home:  Stay on bed rest if told to do so by your health care provider.  Do not lift anything that is heavier than 10 lbs. (4.5 kg) or as told by your health care provider.  Do not use any products that contain nicotine or tobacco, such as cigarettes and e-cigarettes. If you need help quitting, ask your health care provider.  Track and write down the number of pads you use each day and how soaked (saturated) they are.  Do not use tampons.  Keep all follow-up visits as told by your health care provider. This is important. Your health care provider may ask you to have follow-up blood tests or ultrasound tests or both. Contact a health care provider if:  You have any vaginal bleeding.  You have a fever. Get help right away if:  You have severe cramps in your stomach, back, abdomen, or pelvis.  You pass large clots or tissue. Save any tissue for your health care provider to look at.  You have more vaginal bleeding, and you faint or become lightheaded or weak. Summary  A subchorionic hematoma is a gathering of blood between the outer wall of the placenta and the uterus.  This condition can cause vaginal bleeding.  Sometimes you may have no symptoms and the bleeding may only be seen when ultrasound images are taken.  Treatment may include watchful waiting, medicines, or activity restriction. This information is not intended to replace advice given to you by your health care provider. Make sure you discuss any questions you have with your health care provider. Document Revised: 08/08/2017 Document Reviewed: 10/22/2016 Elsevier Patient  Education  2020 [employer]. Suicidal Feelings: How to Help Yourself Suicide is when you end your own life. There are many things you can do to help yourself feel better when struggling with these feelings. Many services and people are available to support you and others who struggle with similar feelings.  If you ever feel like you may hurt yourself or others, or have thoughts about taking your own life, get help right away. To get help:  Call your local emergency services (911 in the [place]  The [place] [address] health and human services helpline (211 in the [place]  Go to your nearest emergency department.  Call a suicide hotline to speak with a trained counselor. The following suicide hotlines are available in the [place]: ? 1-800-273-TALK 862-584-1197). ? 1-800-SUICIDE 541 543 9838). ? 2507923417. This is a hotline for Spanish speakers. ? (510) 082-5037. This is a hotline for TTY users. ? 1-866-4-U-TREVOR 249-235-9452). This is a hotline for lesbian, gay, bisexual, transgender, or questioning youth. ? For a list of hotlines in [place], visit ItCheaper.dk.html  Contact a crisis center or a local suicide prevention center. To find a crisis center or suicide prevention center: ? Call your local hospital, clinic,  community Engineer, building servicesservice organization, mental health center, social service provider, or health department. Ask for help with connecting to a crisis center. ? For a list of crisis centers in the [place], visit: suicidepreventionlifeline.org ? For a list of crisis centers in [place], visit: suicideprevention.ca How to help yourself feel better   Promise yourself that you will not do anything extreme when you have suicidal feelings. Remember, there is hope. Many people have gotten through suicidal thoughts and feelings, and you can too. If you have had these feelings before, remind yourself that you can get through them  again.  Let family, friends, teachers, or counselors know how you are feeling. Try not to separate yourself from those who care about you and want to help you. Talk with someone every day, even if you do not feel sociable. Face-to-face conversation is best to help them understand your feelings.  Contact a mental health care provider and work with this person regularly.  Make a safety plan that you can follow during a crisis. Include phone numbers of suicide prevention hotlines, mental health professionals, and trusted friends and family members you can call during an emergency. Save these numbers on your phone.  If you are thinking of taking a lot of medicine, give your medicine to someone who can give it to you as prescribed. If you are on antidepressants and are concerned you will overdose, tell your health care provider so that he or she can give you safer medicines.  Try to stick to your routines. Follow a schedule every day. Make self-care a priority.  Make a list of realistic goals, and cross them off when you achieve them. Accomplishments can give you a sense of worth.  Wait until you are feeling better before doing things that you find difficult or unpleasant.  Do things that you have always enjoyed to take your mind off your feelings. Try reading a book, or listening to or playing music. Spending time outside, in nature, may help you feel better. Follow these instructions at home:   Visit your primary health care provider every year for a checkup.  Work with a mental health care provider as needed.  Eat a well-balanced diet, and eat regular meals.  Get plenty of rest.  Exercise if you are able. Just 30 minutes of exercise each day can help you feel better.  Take over-the-counter and prescription medicines only as told by your health care provider. Ask your mental health care provider about the possible side effects of any medicines you are taking.  Do not use alcohol or  drugs, and remove these substances from your home.  Remove weapons, poisons, knives, and other deadly items from your home. General recommendations  Keep your living space well lit.  When you are feeling well, write yourself a letter with tips and support that you can read when you are not feeling well.  Remember that life's difficulties can be sorted out with help. Conditions can be treated, and you can learn behaviors and ways of thinking that will help you. Where to find more information  National Suicide Prevention Lifeline: www.suicidepreventionlifeline.org  Hopeline: www.hopeline.com  [employer] for Suicide Prevention: https://www.ayers.com/www.afsp.org  [employer] (for lesbian, gay, bisexual, transgender, or questioning youth): www.thetrevorproject.org Contact a health care provider if:  You feel as though you are a burden to others.  You feel agitated, angry, vengeful, or have extreme mood swings.  You have withdrawn from family and friends. Get help right away if:  You  are talking about suicide or wishing to die.  You start making plans for how to commit suicide.  You feel that you have no reason to live.  You start making plans for putting your affairs in order, saying goodbye, or giving your possessions away.  You feel guilt, shame, or unbearable pain, and it seems like there is no way out.  You are frequently using drugs or alcohol.  You are engaging in risky behaviors that could lead to death. If you have any of these symptoms, get help right away. Call emergency services, go to your nearest emergency department or crisis center, or call a suicide crisis helpline. Summary  Suicide is when you take your own life.  Promise yourself that you will not do anything extreme when you have suicidal feelings.  Let family, friends, teachers, or counselors know how you are feeling.  Get help right away if you feel as though life is getting too tough to handle and you are  thinking about suicide. This information is not intended to replace advice given to you by your health care provider. Make sure you discuss any questions you have with your health care provider. Document Revised: 12/17/2018 Document Reviewed: 04/08/2017 Elsevier Patient Education  2020 [employer].

## 2020-08-06 NOTE — MAU Provider Note (Signed)
History    CSN: [id number]  Arrival date and time: 08/06/20 8032   First Provider Initiated Contact with Patient 08/06/20 1102      Chief Complaint  Patient presents with  . Vaginal Bleeding  . Suicidal   Loretta Baker is a 24 y.o. G7P0060 at [redacted]w[redacted]d by LMP with reports of brown vaginal discharge. Pt reports that she went to the bathroom this morning and noticed some brown discharge on the tissue a long with a small brown clot in the toilet. She reports that since she has had intermittent brown spotting. Has had some lower abdominal pressure but denies pain. No recent intercourse or pelvic exams. On further note, patient reports that she has an extensive psychiatric history and is on multiple medications. She reports that over the past several weeks she has had increased depressed thoughts, most recent incident was last night where she had suicidal thoughts, however did not act upon or have a plan. She does not currently have suicidal thoughts or an active plan to harm herself or others. She reports that when she is alone she often "gets in her thoughts" and feels as if she "doesn't want to be here" or "the world would be better off". She has a psychiatrist however has been unable to get in touch with him.    OB History    Gravida  7   Para      Term      Preterm      AB  6   Living        SAB  4   TAB  2   Ectopic      Multiple      Live Births              Past Medical History:  Diagnosis Date  . Allergy   . Anorexia   . Anxiety   . Bipolar disorder (HCC)   . Borderline personality disorder (HCC)   . Depression     Past Surgical History:  Procedure Laterality Date  . FACIAL RECONSTRUCTION SURGERY    . WISDOM TOOTH EXTRACTION      Family History  Problem Relation Age of Onset  . Arthritis Other   . Miscarriages / Stillbirths Sister   . Alcohol abuse Neg Hx   . Cancer Neg Hx   . Depression Neg Hx   . Diabetes Neg Hx   . Early death Neg Hx   .  Heart disease Neg Hx   . Hyperlipidemia Neg Hx   . Hypertension Neg Hx   . Stroke Neg Hx     Social History   Tobacco Use  . Smoking status: Current Every Day Smoker    Types: E-cigarettes  . Smokeless tobacco: Never Used  . Tobacco comment: uses 1 pod for a few days  Substance Use Topics  . Alcohol use: Not Currently    Alcohol/week: 1.0 standard drink    Types: 1 Shots of liquor per week  . Drug use: Not Currently    Types: Marijuana    Comment: DELTA 8    Allergies: No Known Allergies  Medications Prior to Admission  Medication Sig Dispense Refill Last Dose  . clonazePAM (KLONOPIN) 0.5 MG tablet Take 0.5 mg by mouth 3 (three) times daily.  3 08/06/2020 at Unknown time  . lamoTRIgine (LAMICTAL) 25 MG tablet Take 1 tablet (25 mg total) by mouth 2 (two) times daily. 60 tablet 1 08/05/2020 at Unknown time  . QUEtiapine (SEROQUEL) 100  MG tablet TAKE 1/2 A TABLET EVERY NIGHT FOR 7 DAYS, THEN ONE EVERY NIGHT  3 More than a month at Unknown time    Review of Systems  Constitutional: Negative.   HENT: Negative.   Respiratory: Negative.   Cardiovascular: Negative.   Gastrointestinal: Negative.   Genitourinary: Positive for vaginal bleeding (brown spotting).  Musculoskeletal: Negative.   Neurological: Negative.   Psychiatric/Behavioral:       Depressed mood, hopelessness, increased anxiety      Physical Exam   Blood pressure 108/66, pulse 81, temperature 99 F (37.2 C), temperature source Oral, resp. rate 16, height 5' (1.524 m), weight 48.3 kg, last menstrual period 06/09/2020, SpO2 98 %.  Physical Exam Cardiovascular:     Rate and Rhythm: Normal rate.  Pulmonary:     Effort: Pulmonary effort is normal.  Abdominal:     General: Abdomen is flat.     Palpations: Abdomen is soft.  Genitourinary:    Vagina: Vaginal discharge (brown, mucousy discharge) present.     Comments: Cervix visually closed, pink, smooth, without lesions, no active  bleeding   Musculoskeletal:        General: Normal range of motion.  Skin:    General: Skin is warm.  Neurological:     General: No focal deficit present.     Mental Status: She is alert.  Psychiatric:        Thought Content: Thought content normal.        Judgment: Judgment normal.     Comments: Depressed mood, hopelessness, no current SI/HI     US OB Comp Less 14 Wks  Result Date: 08/06/2020 CLINICAL DATA:  Vaginal bleeding EXAM: OBSTETRIC <14 WK ULTRASOUND TECHNIQUE: Transabdominal ultrasound was performed for evaluation of the gestation as well as the maternal uterus and adnexal regions. COMPARISON:  12/19/2019 FINDINGS: Intrauterine gestational sac: Single Yolk sac:  Visualized. Embryo:  Visualized. Cardiac Activity: Visualized. Heart Rate: 133 bpm CRL:   10.6 mm   7 w 1 d                  [place] EDC: 03/24/2021 Subchorionic hemorrhage:  Small subchronic hemorrhage. Maternal uterus/adnexae: Bilateral ovaries and adnexal regions are within normal limits. No free fluid is seen within the pelvis. IMPRESSION: 1. Single live intrauterine gestation measuring 7 weeks 1 day by crown-rump length. 2. Active embryonic heart tones at 133 bpm. 3. Small subchorionic hemorrhage. Electronically Signed   By: Duanne Guess D.O.   On: 08/06/2020 13:04    MAU Course  Procedures  MDM:  CBC HCG US OB < 14 weeks Wet prep negative GC/CT pending UDS positive for THC Consult to TTS who recommends inpatient admission. Pt refusing voluntary admission; case discussed with behavioral health NP who recommends IVC given that pt is refusing admission and her reports of mood lability, recent self induced vomiting and weight loss of 28 lbs within the past month, as well as a previous history of cutting. 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 She adamantly  denies any suicidal ideations or plans to harm herself. Pt's significant other at bedside as support. Dr. Langston Masker, [facility], and social work notified and on unit. Care handed over to Dr. Langston Masker for further care and management.    Assessment and Plan  1. [redacted] weeks gestation of pregnancy - Viable IUP at  [redacted]w[redacted]d  - Follow up with OBGYN as scheduled  2. Subchorionic hematoma in first trimester, single or unspecified fetus - Pelvic rest  - Strict return and bleeding precautions reviewed  3. At high risk for self harm - TTS consult recommending inpatient admission however pt refusing; discussed case again with behavioral health NP who recommends that given pt reports of potential self harm, IVC is recommended.  - Dr. Langston Masker at patient bedside and care handed over to her     Brand Males, CNM 08/06/2020, 5:52 PM

## 2020-08-06 NOTE — MAU Note (Signed)
PT anxious to leave to attend friends funeral. Excited for [facility] appointment and [place] tomorrow.

## 2020-08-06 NOTE — ED Triage Notes (Signed)
Emergency Medicine Provider OB Triage Evaluation Note  Loretta Baker is a 24 y.o. female, G1P0, at 8 or 9 weeks approximately gestation who presents to the emergency department with complaints of vaginal bleeding/discharge that she noticed this morning. She states that since having a + home pregnancy test she has been having some abdominal discomfort that she attributes to her bowels being "different." She states that she will typically wake up in the morning with this sensation and then go to use the bathroom and "wake up" and then she will feel better. This morning she had this same sensation however when she used the bathroom and then wiped she noticed dark brown discharge/blood that was stringy on the toilet paper. She then looked into the toilet bowl and noticed either a blood clot or a piece of tissue which caused her concern. Pt reports history of multiple miscarriages in the past - she is unsure how many times she has been pregnant and states she stopped counting after the 5th miscarriage.  Review of  Systems  Positive: + vaginal bleeding, + cramping Negative: - fevers, - chills, - urinary symptoms, - chest pain, - shortness of breath  Physical Exam  BP 119/66 (BP Location: Left Arm)   Pulse (!) 104   Temp 98.5 F (36.9 C) (Oral)   Resp 18   LMP 11/19/2019 (Approximate)   SpO2 97%  General: Awake, no distress  HEENT: Atraumatic  Resp: Normal effort  Cardiac: Normal rate Abd: Nondistended,mild lower abdominal TTP MSK: Moves all extremities without difficulty Neuro: Speech clear  Medical Decision Making  Pt evaluated for pregnancy concern and is stable for transfer to MAU. Pt is in agreement with plan for transfer.  10:00 AM Discussed with MAU APP, Shawna Orleans, who accepts patient in transfer.  Clinical Impression  No diagnosis found.  Concern for possible miscarriage with history of same and new onset vaginal bleeding with possible passing of tissue clot   Tanda Rockers,  PA-C 08/06/20 1006

## 2020-08-06 NOTE — MAU Note (Signed)
Pt requesting to speak with provider regarding plan of care. MD and CNM notified

## 2020-08-07 LAB — GC/CHLAMYDIA PROBE AMP (~~LOC~~) NOT AT ARMC
Chlamydia: NEGATIVE
Comment: NEGATIVE
Comment: NORMAL
Neisseria Gonorrhea: NEGATIVE

## 2020-10-17 ENCOUNTER — Inpatient Hospital Stay (HOSPITAL_COMMUNITY)
Admission: AD | Admit: 2020-10-17 | Discharge: 2020-10-17 | Disposition: A | Discharge status: Home / Self Care | Payer: Medicaid Other | Attending: Family Medicine | Admitting: Family Medicine

## 2020-10-17 ENCOUNTER — Other Ambulatory Visit: Payer: Self-pay

## 2020-10-17 ENCOUNTER — Encounter (HOSPITAL_COMMUNITY): Payer: Self-pay | Admitting: Family Medicine

## 2020-10-17 DIAGNOSIS — F17290 Nicotine dependence, other tobacco product, uncomplicated: Secondary | ICD-10-CM | POA: Insufficient documentation

## 2020-10-17 DIAGNOSIS — R102 Pelvic and perineal pain: Secondary | ICD-10-CM | POA: Insufficient documentation

## 2020-10-17 DIAGNOSIS — R1030 Lower abdominal pain, unspecified: Secondary | ICD-10-CM | POA: Insufficient documentation

## 2020-10-17 DIAGNOSIS — O26892 Other specified pregnancy related conditions, second trimester: Secondary | ICD-10-CM | POA: Insufficient documentation

## 2020-10-17 DIAGNOSIS — Z3A17 17 weeks gestation of pregnancy: Secondary | ICD-10-CM | POA: Diagnosis not present

## 2020-10-17 DIAGNOSIS — O99332 Smoking (tobacco) complicating pregnancy, second trimester: Secondary | ICD-10-CM | POA: Diagnosis not present

## 2020-10-17 DIAGNOSIS — O99891 Other specified diseases and conditions complicating pregnancy: Secondary | ICD-10-CM

## 2020-10-17 DIAGNOSIS — Z79899 Other long term (current) drug therapy: Secondary | ICD-10-CM | POA: Insufficient documentation

## 2020-10-17 LAB — WET PREP, GENITAL
Clue Cells Wet Prep HPF POC: NONE SEEN
Sperm: NONE SEEN
Trich, Wet Prep: NONE SEEN
Yeast Wet Prep HPF POC: NONE SEEN

## 2020-10-17 LAB — URINALYSIS, ROUTINE W REFLEX MICROSCOPIC
Bilirubin Urine: NEGATIVE
Glucose, UA: NEGATIVE mg/dL
Hgb urine dipstick: NEGATIVE
Ketones, ur: NEGATIVE mg/dL
Leukocytes,Ua: NEGATIVE
Nitrite: NEGATIVE
Protein, ur: NEGATIVE mg/dL
Specific Gravity, Urine: 1.004 — ABNORMAL LOW (ref 1.005–1.030)
pH: 7 (ref 5.0–8.0)

## 2020-10-17 LAB — HIV ANTIBODY (ROUTINE TESTING W REFLEX): HIV Screen 4th Generation wRfx: NONREACTIVE

## 2020-10-17 NOTE — MAU Note (Signed)
Pt reports lower abdominal cramping for about a week. It comes and goes. Also reporting bilateral pain on her sides which comes with the abdominal cramping. She denies LOF or VB.   Per pt. She tested positive for Covid last Tuesday on a rapid test, but PCR test was negative. Also states she has no Covid symptoms currently.

## 2020-10-17 NOTE — MAU Provider Note (Signed)
History     CSN: [id number]  Arrival date and time: 10/17/20 1257   Event Date/Time   First Provider Initiated Contact with Patient 10/17/20 1353      Chief Complaint  Patient presents with  . Abdominal Cramping   Loretta Baker is a 25 y.o. year old G44P0060 female at [redacted]w[redacted]d weeks gestation who presents to MAU reporting bilateral lower abdominal cramping that comes and goes. She denies LOF or VB. She reports that she was scheduled to see her OB at [facility] on 10/10/2020. Since she had COVID-19 sx's, they did not allow her to have her appointment. She reports she tested (+) for COVID-19 on 2/1, then negative by PCR on 2/5 & 2/7. She is concerned that she has not seen her baby on U/S since 7 wks. She is requesting an U/S during today's MAU visit.   OB History    Gravida  7   Para      Term      Preterm      AB  6   Living        SAB  4   IAB  2   Ectopic      Multiple      Live Births              Past Medical History:  Diagnosis Date  . Allergy   . Anorexia   . Anxiety   . Bipolar disorder (HCC)   . Borderline personality disorder (HCC)   . Depression     Past Surgical History:  Procedure Laterality Date  . FACIAL RECONSTRUCTION SURGERY    . WISDOM TOOTH EXTRACTION      Family History  Problem Relation Age of Onset  . Arthritis Other   . Miscarriages / Stillbirths Sister   . Alcohol abuse Neg Hx   . Cancer Neg Hx   . Depression Neg Hx   . Diabetes Neg Hx   . Early death Neg Hx   . Heart disease Neg Hx   . Hyperlipidemia Neg Hx   . Hypertension Neg Hx   . Stroke Neg Hx     Social History   Tobacco Use  . Smoking status: Current Every Day Smoker    Types: E-cigarettes  . Smokeless tobacco: Never Used  . Tobacco comment: uses 1 pod for a few days  Substance Use Topics  . Alcohol use: Not Currently    Alcohol/week: 1.0 standard drink    Types: 1 Shots of liquor per week  . Drug use: Not Currently     Types: Marijuana    Comment: DELTA 8    Allergies: No Known Allergies  Medications Prior to Admission  Medication Sig Dispense Refill Last Dose  . clonazePAM (KLONOPIN) 0.5 MG tablet Take 0.5 mg by mouth 3 (three) times daily.  3 10/17/2020 at Unknown time  . lamoTRIgine (LAMICTAL) 25 MG tablet Take 1 tablet (25 mg total) by mouth 2 (two) times daily. 60 tablet 1 10/17/2020 at Unknown time  . Prenatal Vit-Fe Fumarate-FA (MULTIVITAMIN-PRENATAL) 27-0.8 MG TABS tablet Take 1 tablet by mouth daily at 12 noon.   10/17/2020 at Unknown time  . QUEtiapine (SEROQUEL) 100 MG tablet TAKE 1/2 A TABLET EVERY NIGHT FOR 7 DAYS, THEN ONE EVERY NIGHT  3     Review of Systems  Constitutional: Negative.   HENT: Negative.   Eyes: Negative.   Respiratory: Negative.   Cardiovascular: Negative.   Gastrointestinal: Negative.  Endocrine: Negative.   Genitourinary: Positive for pelvic pain (cramping).  Musculoskeletal: Negative.   Skin: Negative.   Allergic/Immunologic: Negative.   Neurological: Negative.   Hematological: Negative.   Psychiatric/Behavioral: Negative.    Physical Exam   Blood pressure 102/60, pulse 91, temperature 97.9 F (36.6 C), temperature source Oral, resp. rate 16, last menstrual period 06/09/2020.  Physical Exam Vitals and nursing note reviewed. Exam conducted with a chaperone present.  Constitutional:      Appearance: Normal appearance. She is normal weight.  Cardiovascular:     Rate and Rhythm: Normal rate.     Pulses: Normal pulses.  Pulmonary:     Effort: Pulmonary effort is normal.  Abdominal:     General: Abdomen is flat.     Palpations: Abdomen is soft.  Genitourinary:    General: Normal vulva.     Comments: Uterus: non-tender, SE: cervix is smooth, pink, no lesions, small amt of thick, white vaginal d/c -- WP, GC/CT done, closed/long/firm, no CMT or friability, no adnexal tenderness  Musculoskeletal:        General: Normal range of motion.  Skin:    General: Skin  is warm and dry.  Neurological:     Mental Status: She is alert and oriented to person, place, and time.  Psychiatric:        Mood and Affect: Mood normal.        Behavior: Behavior normal.        Thought Content: Thought content normal.        Judgment: Judgment normal.    FHTs by doppler: 158 bpm  MAU Course  Procedures  Patient informed that the ultrasound is considered a limited OB ultrasound and is not intended to be a complete ultrasound exam.  Patient also informed that the ultrasound is not being completed with the intent of assessing for fetal or placental anomalies or any pelvic abnormalities.  Explained that the purpose of today's ultrasound is to assess for viability.  Baby was found to be very active. Patient is very happy and reassured of the well-being of her baby. Patient acknowledges the purpose of the exam and the limitations of the study.   MDM Wet Prep GC/CT -- Results pending    Results for orders placed or performed during the hospital encounter of 10/17/20 (from the past 24 hour(s))  Urinalysis, Routine w reflex microscopic Urine, Clean Catch     Status: Abnormal   Collection Time: 10/17/20  2:17 PM  Result Value Ref Range   Color, Urine COLORLESS (A) YELLOW   APPearance CLEAR CLEAR   Specific Gravity, Urine 1.004 (L) 1.005 - 1.030   pH 7.0 5.0 - 8.0   Glucose, UA NEGATIVE NEGATIVE mg/dL   Hgb urine dipstick NEGATIVE NEGATIVE   Bilirubin Urine NEGATIVE NEGATIVE   Ketones, ur NEGATIVE NEGATIVE mg/dL   Protein, ur NEGATIVE NEGATIVE mg/dL   Nitrite NEGATIVE NEGATIVE   Leukocytes,Ua NEGATIVE NEGATIVE  Wet prep, genital     Status: Abnormal   Collection Time: 10/17/20  2:17 PM  Result Value Ref Range   Yeast Wet Prep HPF POC NONE SEEN NONE SEEN   Trich, Wet Prep NONE SEEN NONE SEEN   Clue Cells Wet Prep HPF POC NONE SEEN NONE SEEN   WBC, Wet Prep HPF POC MANY (A) NONE SEEN   Sperm NONE SEEN     Assessment and Plan  Pregnancy related bilateral lower  abdominal cramping, antepartum  - Information provided on abdominal pain in pregnancy  Pain of round ligament complicating pregnancy, antepartum - Information provided on RLP and comfort measures for RLP   - Discharge patient - F/U with current OB office for next visit - Patient verbalized an understanding of the plan of care and agrees.   Raelyn Mora, CNM 10/17/2020, 1:54 PM

## 2020-10-17 NOTE — MAU Note (Addendum)
Pt arrived complaining of lower abd pain that started a few weeks that has intensified over time 7/10pain. NO vaginal bleeding and or leakage of fluid.  Patient stated that she got a rapid pcr 10/10/20 and she resulted + at her gyn 10/14/20, and 10/16/20 she took a store brought pcr and resulted negative. Patient denies any symptoms at this time.

## 2020-10-17 NOTE — Discharge Instructions (Signed)
Comfort Measures for Round Ligament Pain:  Soak in a warm tub Tylenol 1000 mg by mouth every 6-8 hrs as needed for pain Slow position changes Laying on the affected side

## 2020-10-18 LAB — GC/CHLAMYDIA PROBE AMP (~~LOC~~) NOT AT ARMC
Chlamydia: NEGATIVE
Comment: NEGATIVE
Comment: NORMAL
Neisseria Gonorrhea: NEGATIVE

## 2020-12-22 ENCOUNTER — Inpatient Hospital Stay (HOSPITAL_COMMUNITY)
Admission: AD | Admit: 2020-12-22 | Discharge: 2020-12-22 | Disposition: A | Discharge status: Home / Self Care | Payer: Medicaid Other | Attending: Obstetrics & Gynecology | Admitting: Obstetrics & Gynecology

## 2020-12-22 ENCOUNTER — Encounter (HOSPITAL_COMMUNITY): Payer: Self-pay | Admitting: Obstetrics & Gynecology

## 2020-12-22 ENCOUNTER — Other Ambulatory Visit: Payer: Self-pay

## 2020-12-22 DIAGNOSIS — F419 Anxiety disorder, unspecified: Secondary | ICD-10-CM | POA: Insufficient documentation

## 2020-12-22 DIAGNOSIS — Z79899 Other long term (current) drug therapy: Secondary | ICD-10-CM | POA: Diagnosis not present

## 2020-12-22 DIAGNOSIS — F603 Borderline personality disorder: Secondary | ICD-10-CM | POA: Insufficient documentation

## 2020-12-22 DIAGNOSIS — Z3A26 26 weeks gestation of pregnancy: Secondary | ICD-10-CM | POA: Diagnosis not present

## 2020-12-22 DIAGNOSIS — Z3689 Encounter for other specified antenatal screening: Secondary | ICD-10-CM

## 2020-12-22 DIAGNOSIS — F319 Bipolar disorder, unspecified: Secondary | ICD-10-CM | POA: Diagnosis not present

## 2020-12-22 DIAGNOSIS — O368120 Decreased fetal movements, second trimester, not applicable or unspecified: Secondary | ICD-10-CM | POA: Insufficient documentation

## 2020-12-22 DIAGNOSIS — O99342 Other mental disorders complicating pregnancy, second trimester: Secondary | ICD-10-CM | POA: Diagnosis not present

## 2020-12-22 HISTORY — DX: Anemia, unspecified: D64.9

## 2020-12-22 NOTE — Discharge Instructions (Signed)
Fetal Movement Counts Patient Name: ________________________________________________ Patient Due Date: ____________________  What is a fetal movement count? A fetal movement count is the number of times that you feel your baby move during a certain amount of time. This may also be called a fetal kick count. A fetal movement count is recommended for every pregnant woman. You may be asked to start counting fetal movements as early as week 28 of your pregnancy. Pay attention to when your baby is most active. You may notice your baby's sleep and wake cycles. You may also notice things that make your baby move more. You should do a fetal movement count:  When your baby is normally most active.  At the same time each day. A good time to count movements is while you are resting, after having something to eat and drink. How do I count fetal movements? 1. Find a quiet, comfortable area. Sit, or lie down on your side. 2. Write down the date, the start time and stop time, and the number of movements that you felt between those two times. Take this information with you to your health care visits. 3. Write down your start time when you feel the first movement. 4. Count kicks, flutters, swishes, rolls, and jabs. You should feel at least 10 movements. 5. You may stop counting after you have felt 10 movements, or if you have been counting for 2 hours. Write down the stop time. 6. If you do not feel 10 movements in 2 hours, contact your health care provider for further instructions. Your health care provider may want to do additional tests to assess your baby's well-being. Contact a health care provider if:  You feel fewer than 10 movements in 2 hours.  Your baby is not moving like he or she usually does. Date: ____________ Start time: ____________ Stop time: ____________ Movements: ____________ Date: ____________ Start time: ____________ Stop time: ____________ Movements: ____________ Date: ____________  Start time: ____________ Stop time: ____________ Movements: ____________ Date: ____________ Start time: ____________ Stop time: ____________ Movements: ____________ Date: ____________ Start time: ____________ Stop time: ____________ Movements: ____________ Date: ____________ Start time: ____________ Stop time: ____________ Movements: ____________ Date: ____________ Start time: ____________ Stop time: ____________ Movements: ____________ Date: ____________ Start time: ____________ Stop time: ____________ Movements: ____________ Date: ____________ Start time: ____________ Stop time: ____________ Movements: ____________ This information is not intended to replace advice given to you by your health care provider. Make sure you discuss any questions you have with your health care provider. Document Revised: 04/15/2019 Document Reviewed: 04/15/2019 Elsevier Patient Education  2021 [employer]  

## 2020-12-22 NOTE — MAU Note (Signed)
While reviewing meds, pt stated she thought she had taken 2 doses of her Lamictal yesterday, was kind of scared about what that did to the baby, esp when realized the baby wasn't moving as much.

## 2020-12-22 NOTE — MAU Note (Signed)
Has only felt the baby move a couple times since yesterday.  Denies pain. No bleeding or leaking.

## 2020-12-22 NOTE — MAU Provider Note (Signed)
History     CSN: [id number]  Arrival date and time: 12/22/20 0954   Event Date/Time   First Provider Initiated Contact with Patient 12/22/20 1034      Chief Complaint  Patient presents with  . Decreased Fetal Movement   HPI Loretta Baker is a 25 y.o. G7P0060 at [redacted]w[redacted]d who presents to MAU with chief complaint of decreased fetal movement, new onset last night and continuing this morning. She states she was working and realized she hadn't felt her baby move. She mentioned this to her boss, who encouraged her to be evaluated. She reported to MAU Triage RN that she accidentally took an extra Lamictal dose last night and was too tired to present for evaluation. She endorses feeling fetal movement on arrival to MAU and throughout her period of evaluation. She denies abdominal pain, vaginal bleeding, leaking of fluid, fever, falls, or recent illness.   She receives care in the [facility].  OB History    Gravida  7   Para      Term      Preterm      AB  6   Living        SAB  4   IAB  2   Ectopic      Multiple      Live Births              Past Medical History:  Diagnosis Date  . Allergy   . Anemia   . Anorexia   . Anxiety   . Bipolar disorder (HCC)   . Borderline personality disorder (HCC)   . Depression     Past Surgical History:  Procedure Laterality Date  . FACIAL RECONSTRUCTION SURGERY    . WISDOM TOOTH EXTRACTION      Family History  Problem Relation Age of Onset  . Arthritis Other   . Miscarriages / Stillbirths Sister   . Arthritis Mother   . Asthma Father   . Bipolar disorder Father   . Alcohol abuse Neg Hx   . Cancer Neg Hx   . Depression Neg Hx   . Diabetes Neg Hx   . Early death Neg Hx   . Heart disease Neg Hx   . Hyperlipidemia Neg Hx   . Hypertension Neg Hx   . Stroke Neg Hx     Social History   Tobacco Use  . Smoking status: Never Smoker  . Smokeless tobacco: Never Used  Vaping Use  . Vaping Use: Every day  .  Devices: uses one pod every few days  Substance Use Topics  . Alcohol use: Not Currently    Alcohol/week: 1.0 standard drink    Types: 1 Shots of liquor per week  . Drug use: Not Currently    Types: Marijuana    Comment: DELTA 8    Allergies: No Known Allergies  Medications Prior to Admission  Medication Sig Dispense Refill Last Dose  . clonazePAM (KLONOPIN) 0.5 MG tablet Take 0.5 mg by mouth 3 (three) times daily.  3 12/22/2020 at Unknown time  . lamoTRIgine (LAMICTAL) 25 MG tablet Take 1 tablet (25 mg total) by mouth 2 (two) times daily. (Patient taking differently: Take 300 mg by mouth daily.) 60 tablet 1 12/21/2020 at Unknown time  . Prenatal Vit-Fe Fumarate-FA (MULTIVITAMIN-PRENATAL) 27-0.8 MG TABS tablet Take 1 tablet by mouth daily at 12 noon.   12/21/2020 at Unknown time  . QUEtiapine (SEROQUEL) 100 MG tablet TAKE 1/2 A TABLET EVERY NIGHT FOR  7 DAYS, THEN ONE EVERY NIGHT  3     Review of Systems  Gastrointestinal: Negative for abdominal pain.  Genitourinary: Negative for vaginal bleeding.  Musculoskeletal: Negative for back pain.  All other systems reviewed and are negative.  Physical Exam   Blood pressure 106/62, pulse 84, temperature 98.7 F (37.1 C), resp. rate 16, height 5' (1.524 m), weight 57.2 kg, last menstrual period 06/09/2020, SpO2 98 %.  Physical Exam Vitals and nursing note reviewed. Exam conducted with a chaperone present.  Constitutional:      General: She is not in acute distress.    Appearance: Normal appearance. She is not ill-appearing.  Cardiovascular:     Rate and Rhythm: Normal rate.     Pulses: Normal pulses.  Pulmonary:     Effort: Pulmonary effort is normal.     Breath sounds: Normal breath sounds.  Abdominal:     Comments: Gravid  Skin:    Capillary Refill: Capillary refill takes less than 2 seconds.  Neurological:     Mental Status: She is alert and oriented to person, place, and time.  Psychiatric:        Mood and Affect: Mood normal.         Behavior: Behavior normal.        Thought Content: Thought content normal.        Judgment: Judgment normal.     MAU Course  Procedures  --Reassuring fetal tracing x 1 hour prolonged monitoring --Baseline 140, mod var, + 10 x 10 accels, no decels --Toco: quiet --Due to lack of other symptoms, cervical exam not advised. Pt agreeable to deferring cervical exam. --Discussed developmental expectations for degree and frequency of fetal movement at current GA. Reviewed interventions to facilitate fetal movement.  Patient Vitals for the past 24 hrs:  BP Temp Pulse Resp SpO2 Height Weight  12/22/20 1126 104/62 -- 78 -- -- -- --  12/22/20 1125 -- -- -- -- 97 % -- --  12/22/20 1031 106/62 -- 84 -- -- -- --  12/22/20 1030 -- -- -- -- 98 % -- --  12/22/20 1022 106/63 98.7 F (37.1 C) 83 16 -- -- --  12/22/20 1015 -- 98.7 F (37.1 C) -- 18 98 % -- --  12/22/20 1007 -- -- -- -- -- 5' (1.524 m) 57.2 kg    Assessment and Plan  --25 y.o. G7P0060 at [redacted]w[redacted]d  --Reactive tracing --Pt endorsing fetal movement throughout time in MAU --Discharge home in stable condition  Calvert Cantor, MSA, MSN, CNM 12/22/2020, 2:26 PM

## 2021-04-29 ENCOUNTER — Other Ambulatory Visit: Payer: Self-pay

## 2021-04-29 ENCOUNTER — Emergency Department (HOSPITAL_COMMUNITY)
Admission: EM | Admit: 2021-04-29 | Discharge: 2021-04-29 | Disposition: A | Discharge status: Home / Self Care | Payer: Medicaid Other | Attending: Emergency Medicine | Admitting: Emergency Medicine

## 2021-04-29 ENCOUNTER — Encounter (HOSPITAL_COMMUNITY): Payer: Self-pay | Admitting: Oncology

## 2021-04-29 DIAGNOSIS — S3141XD Laceration without foreign body of vagina and vulva, subsequent encounter: Secondary | ICD-10-CM | POA: Diagnosis not present

## 2021-04-29 DIAGNOSIS — S3095XA Unspecified superficial injury of vagina and vulva, initial encounter: Secondary | ICD-10-CM | POA: Diagnosis present

## 2021-04-29 DIAGNOSIS — X58XXXA Exposure to other specified factors, initial encounter: Secondary | ICD-10-CM | POA: Diagnosis not present

## 2021-04-29 NOTE — ED Triage Notes (Signed)
Pt delivered vaginally 03/29/21.  Today pt states her incision has, "Opened all the way up"  Pt has been seen x 3 by OBGYN.  Pt reports not being able to sit, stand or find comfortable position.

## 2021-04-29 NOTE — ED Provider Notes (Signed)
Emergency Medicine Provider Triage Evaluation Note  Loretta Baker , a 25 y.o. adult  was evaluated in triage.  Pt complains of tear to perineum.  Patient reports that she had vaginal delivery on 03/29/2021.  Patient had tear to perineum during pregnancy which required sutures.  Patient reports that she feels that the area has "opened all the way up" reports that she has followed up with her OB/GYN provider 3 times since pregnancy most recently on 8/17.  Review of Systems  Positive: Wound Negative:   Physical Exam  BP 117/74 (BP Location: Left Arm)   Pulse (!) 114   Temp 98.4 F (36.9 C) (Oral)   Resp 16   Ht 5\' 1"  (1.549 m)   Wt 56.7 kg   LMP 06/09/2020   SpO2 99%   Breastfeeding Unknown   BMI 23.62 kg/m  Gen:   Awake, no distress   Resp:  Normal effort  MSK:   Moves extremities without difficulty  Other:    Medical Decision Making  Medically screening exam initiated at 3:46 PM.  Appropriate orders placed.  ELNORE COSENS was informed that the remainder of the evaluation will be completed by another provider, this initial triage assessment does not replace that evaluation, and the importance of remaining in the ED until their evaluation is complete.  The patient appears stable so that the remainder of the work up may be completed by another provider.      Sylvester Harder, PA-C 04/29/21 1548    05/01/21, MD 05/02/21 [phone]

## 2021-04-29 NOTE — Discharge Instructions (Addendum)
You came to the emergency department today to be evaluated for your vaginal laceration evaluated.  There was no signs of infection to the laceration.  The wound appears different than detailed by your OB/GYN provider on 8/17.  Please follow-up with them for further management  Get help right away if you: Feel light-headed. Have nausea or vomiting. Have severe pain around your vagina or in your pelvis or lower abdomen. Have heavy vaginal bleeding, or you are soaking more than 1 pad an hour.

## 2021-04-29 NOTE — ED Provider Notes (Signed)
[facility]-EMERGENCY DEPT Provider Note   CSN: [id number] Arrival date & time: 04/29/21  1522     History Chief Complaint  Patient presents with   Post-op Problem    Loretta Baker is a 25 y.o. adult with history of anxiety, bipolar disorder, borderline personality disorder, depression.  Presents to the emergency department with a chief complaint of perineum tear after vaginal delivery.  Patient reports that she had vaginal delivery on 03/29/2021.  Patient states that during pregnancy she had a tear to her perineum which was repaired by OB/GYN provider.  Patient states that tear has slowly been growing larger since her delivery.  Patient states that this morning it "looks completely different, like a new tear."  Patient complains of pain to affected area.  Patient rates pain 10/10 on the pain scale.  Pain is worse with movement or sitting.  Pain is improved with ibuprofen.  Pain is constant.  Patient reports that she has seen her OB/GYN provider 3 times since her delivery.  Patient was prescribed lidocaine however does not use this.  Patient has not had any improvement in her symptoms with witch hazel.  Patient denies any bleeding.  Is unsure if there is any discharge.  Patient reports that she has been straining to have bowel movements.    Patient denies any dysuria, hematuria, vaginal pain, vaginal bleeding, vaginal discharge.  HPI     Past Medical History:  Diagnosis Date   Allergy    Anemia    Anorexia    Anxiety    Bipolar disorder (HCC)    Borderline personality disorder [facility])    Depression     Patient Active Problem List   Diagnosis Date Noted   Severe episode of recurrent major depressive disorder, without psychotic features (HCC)    Routine general medical examination at a health care facility 08/24/2018   Vaginal discharge 08/24/2018   Cervical cancer screening 08/24/2018   Need for HPV vaccine 08/24/2018   Bacterial vaginitis 08/24/2018    Marijuana abuse 07/23/2018   Tobacco abuse 07/23/2018   Bipolar 1 disorder (HCC) 07/23/2018   Numbness and tingling of upper and lower extremities of both sides 07/23/2018   GAD (generalized anxiety disorder) 12/14/2014    Past Surgical History:  Procedure Laterality Date   FACIAL RECONSTRUCTION SURGERY     WISDOM TOOTH EXTRACTION       OB History     Gravida  7   Para      Term      Preterm      AB  6   Living         SAB  4   IAB  2   Ectopic      Multiple      Live Births              Family History  Problem Relation Age of Onset   Arthritis Other    Miscarriages / Stillbirths Sister    Arthritis Mother    Asthma Father    Bipolar disorder Father    Alcohol abuse Neg Hx    Cancer Neg Hx    Depression Neg Hx    Diabetes Neg Hx    Early death Neg Hx    Heart disease Neg Hx    Hyperlipidemia Neg Hx    Hypertension Neg Hx    Stroke Neg Hx     Social History   Tobacco Use   Smoking status: Never  Smokeless tobacco: Never  Vaping Use   Vaping Use: Every day   Devices: uses one pod every few days  Substance Use Topics   Alcohol use: Not Currently    Alcohol/week: 1.0 standard drink    Types: 1 Shots of liquor per week   Drug use: Not Currently    Types: Marijuana    Comment: DELTA 8    Home Medications Prior to Admission medications   Medication Sig Start Date End Date Taking? Authorizing Provider  clonazePAM (KLONOPIN) 0.5 MG tablet Take 0.5 mg by mouth 3 (three) times daily. 08/01/18   [provider]  lamoTRIgine (LAMICTAL) 25 MG tablet Take 1 tablet (25 mg total) by mouth 2 (two) times daily. Patient taking differently: Take 300 mg by mouth daily. 07/23/18   Etta Grandchild, MD  Prenatal Vit-Fe Fumarate-FA (MULTIVITAMIN-PRENATAL) 27-0.8 MG TABS tablet Take 1 tablet by mouth daily at 12 noon.    [provider]  QUEtiapine (SEROQUEL) 100 MG tablet TAKE 1/2 A TABLET EVERY NIGHT FOR 7 DAYS, THEN ONE EVERY NIGHT  08/01/18   [provider]    Allergies    Patient has no known allergies.  Review of Systems   Review of Systems  Constitutional:  Negative for chills and fever.  Gastrointestinal:  Negative for abdominal pain, diarrhea, nausea and vomiting.  Genitourinary:  Negative for difficulty urinating, dysuria, frequency, genital sores, pelvic pain, vaginal bleeding, vaginal discharge and vaginal pain.  Skin:  Positive for wound. Negative for color change, pallor and rash.   Physical Exam Updated Vital Signs BP 93/70   Pulse 94   Temp 98.4 F (36.9 C) (Oral)   Resp 16   Ht 5\' 1"  (1.549 m)   Wt 56.7 kg   LMP 06/09/2020   SpO2 100%   Breastfeeding Unknown   BMI 23.62 kg/m   Physical Exam Vitals and nursing note reviewed. Exam conducted with a chaperone present (Female RN present as chaperone).  Constitutional:      General: She is not in acute distress.    Appearance: She is not ill-appearing, toxic-appearing or diaphoretic.  HENT:     Head: Normocephalic.  Eyes:     General: No scleral icterus.       Right eye: No discharge.        Left eye: No discharge.  Cardiovascular:     Rate and Rhythm: Normal rate.  Pulmonary:     Effort: Pulmonary effort is normal.  Genitourinary:    Exam position: Lithotomy position.     Pubic Area: No rash or pubic lice.      Tanner stage (genital): 5.     Labia:        Right: No rash, tenderness, lesion or injury.        Left: No rash, tenderness, lesion or injury.        Comments: 0.3cm and 0.2cm lacerations noted to perineum Skin:    General: Skin is warm and dry.  Neurological:     General: No focal deficit present.     Mental Status: She is alert.  Psychiatric:        Behavior: Behavior is cooperative.     ED Results / Procedures / Treatments   Labs (all labs ordered are listed, but only abnormal results are displayed) Labs Reviewed - No data to display  EKG None  Radiology No results  found.  Procedures Procedures   Medications Ordered in ED Medications - No data to display  ED  Course  I have reviewed the triage vital signs and the nursing notes.  Pertinent labs & imaging results that were available during my care of the patient were reviewed by me and considered in my medical decision making (see chart for details).    MDM Rules/Calculators/A&P                           Alert 25 year old female no acute stress, nontoxic-appearing.  Presents emergency department with a chief complaint of tear to perineum after vaginal delivery on 03/29/21.  Per chart review patient has followed up with OB/GYN provider since pregnancy, most recently on 8/17.  Pelvic exam at that time showed linear abrasion noted to perineum, no drainage or redness to surrounding area.  2 small lacerations noted to perineum, no surrounding, rash, or purulent drainage noted.  Wounds are shallow, will have patient follow-up with OB/GYN provider for further management.  Discussed results, findings, treatment and follow up. Patient advised of return precautions. Patient verbalized understanding and agreed with plan.   Final Clinical Impression(s) / ED Diagnoses Final diagnoses:  Laceration of vagina, unspecified whether obstetric, subsequent encounter    Rx / DC Orders ED Discharge Orders     None        Berneice Heinrich 04/29/21 1741    Cathren Laine, MD 05/02/21 [phone]
